# Patient Record
Sex: Female | Born: 1965 | Race: White | Hispanic: No | Marital: Married | Smoking: Never smoker
Health system: Southern US, Community
[De-identification: ages and names within clinical notes are randomized; demographics above are authoritative.]

## PROBLEM LIST (undated history)

## (undated) ENCOUNTER — Emergency Department (HOSPITAL_COMMUNITY): Payer: 59 | Source: Home / Self Care

## (undated) DIAGNOSIS — F419 Anxiety disorder, unspecified: Secondary | ICD-10-CM

## (undated) DIAGNOSIS — K219 Gastro-esophageal reflux disease without esophagitis: Secondary | ICD-10-CM

## (undated) DIAGNOSIS — I10 Essential (primary) hypertension: Secondary | ICD-10-CM

## (undated) DIAGNOSIS — E785 Hyperlipidemia, unspecified: Secondary | ICD-10-CM

## (undated) DIAGNOSIS — G43909 Migraine, unspecified, not intractable, without status migrainosus: Secondary | ICD-10-CM

## (undated) DIAGNOSIS — M1990 Unspecified osteoarthritis, unspecified site: Secondary | ICD-10-CM

## (undated) DIAGNOSIS — Z8489 Family history of other specified conditions: Secondary | ICD-10-CM

## (undated) DIAGNOSIS — F32A Depression, unspecified: Secondary | ICD-10-CM

## (undated) HISTORY — DX: Gastro-esophageal reflux disease without esophagitis: K21.9

## (undated) HISTORY — PX: SPINE SURGERY: SHX786

## (undated) HISTORY — DX: Essential (primary) hypertension: I10

## (undated) HISTORY — DX: Unspecified osteoarthritis, unspecified site: M19.90

## (undated) HISTORY — DX: Hyperlipidemia, unspecified: E78.5

## (undated) HISTORY — PX: COLONOSCOPY: SHX174

---

## 2008-10-08 ENCOUNTER — Ambulatory Visit: Payer: Self-pay

## 2009-12-29 ENCOUNTER — Ambulatory Visit: Payer: Self-pay

## 2013-06-19 ENCOUNTER — Ambulatory Visit: Payer: Self-pay

## 2013-06-21 ENCOUNTER — Ambulatory Visit: Payer: Self-pay

## 2013-12-26 ENCOUNTER — Ambulatory Visit: Payer: Self-pay

## 2014-09-19 ENCOUNTER — Other Ambulatory Visit: Payer: Self-pay | Admitting: Internal Medicine

## 2014-09-29 ENCOUNTER — Ambulatory Visit
Admission: RE | Admit: 2014-09-29 | Discharge: 2014-09-29 | Disposition: A | Discharge status: Home / Self Care | Payer: Self-pay | Source: Ambulatory Visit | Attending: Oncology | Admitting: Oncology

## 2014-09-29 ENCOUNTER — Encounter: Payer: Self-pay | Admitting: *Deleted

## 2014-09-29 ENCOUNTER — Ambulatory Visit: Payer: Self-pay | Attending: Oncology | Admitting: *Deleted

## 2014-09-29 VITALS — BP 123/81 | HR 94 | Temp 98.9°F | Ht 68.11 in | Wt 159.4 lb

## 2014-09-29 DIAGNOSIS — Z0000 Encounter for general adult medical examination without abnormal findings: Secondary | ICD-10-CM

## 2014-09-29 NOTE — Progress Notes (Signed)
Subjective:     Patient ID: Annette Rojas, female   DOB: [birth year], 49 y.o.   MRN: [id number]  HPI   Review of Systems     Objective:   Physical Exam  Pulmonary/Chest: Right breast exhibits no inverted nipple, no mass, no nipple discharge, no skin change and no tenderness. Left breast exhibits no inverted nipple, no mass, no nipple discharge, no skin change and no tenderness. Breasts are symmetrical.       Assessment:      49 year old White female returns to [facility] for annual exam.  Clinical breast exam unremarkable.  Patient states she experienced clear bilateral discharge about a year ago.  Explained that if it happens again to report back to [facility] for further evaluation.  She is agreeable.  Taught self breast awareness.    Plan:     Bilateral screening mammogram ordered.  Follow-up per protocol.

## 2014-09-30 ENCOUNTER — Encounter: Payer: Self-pay | Admitting: *Deleted

## 2014-09-30 NOTE — Progress Notes (Signed)
Letter mailed from the [facility] to inform patient of her normal mammogram results.  Patient is to follow-up with annual screening in one year.  [facility] to [name]. 

## 2015-09-07 ENCOUNTER — Other Ambulatory Visit: Payer: Self-pay | Admitting: Internal Medicine

## 2015-09-23 ENCOUNTER — Ambulatory Visit: Payer: Self-pay | Attending: Internal Medicine

## 2015-09-23 ENCOUNTER — Ambulatory Visit
Admission: RE | Admit: 2015-09-23 | Discharge: 2015-09-23 | Disposition: A | Discharge status: Home / Self Care | Payer: Self-pay | Source: Ambulatory Visit | Attending: Internal Medicine | Admitting: Internal Medicine

## 2015-09-23 ENCOUNTER — Other Ambulatory Visit: Payer: Self-pay

## 2015-09-23 VITALS — BP 124/86 | HR 80 | Temp 98.5°F | Ht 68.9 in | Wt 169.6 lb

## 2015-09-23 DIAGNOSIS — N6452 Nipple discharge: Secondary | ICD-10-CM

## 2015-09-23 NOTE — Progress Notes (Signed)
Subjective:     Patient ID: Annette Rojas, female   DOB: 30-Apr-1965, 50 y.o.   MRN: [id number]  HPI   Review of Systems     Objective:   Physical Exam  Pulmonary/Chest: Right breast exhibits nipple discharge. Right breast exhibits no inverted nipple, no mass, no skin change and no tenderness. Left breast exhibits no inverted nipple, no mass, no nipple discharge, no skin change and no tenderness. Breasts are symmetrical.       Assessment:     50 year old patient presents for [facility] clinic visit.  Patient screened, and meets BCCCP eligibility.  Patient does not have insurance, Medicare or Medicaid.  Handout given on Affordable Care Act.Instructed patient on breast self-exam using teach back method.  Patient reports she has been experiencing intermittent, clear, spontaneous right breast nipple discharge.  Finds quarter size discharge on clothing.  Last episode one month ago.  This was noted on annual exam in 2016 also.  Non-smoker.  Patient is also experiencing perimenopausal symptoms of hot flashes, bilateral breast tenderness, irregular menstrual cycles.  States she has had a twenty pound weight gain over past year.  No palpable findings.      Plan:     Sent for bilateral diagnostic mammogram, and ultrasound.  Follow-up dependent on findings, but explained to patient may plan referral to [place].

## 2015-09-23 NOTE — Progress Notes (Signed)
Radiologist recommends Bilateral breast MRI.  Orders in.  Annette Rojas to schedule appointment.  Voucher for payment faxed to be paid through [employer].

## 2015-10-15 NOTE — Progress Notes (Signed)
Patient phoned on 10/14/15 stating she was having difficulty scheduling breast MRI due to irregular menstrual cycle.  Spoke to [employer].  Patient is instructed to phone [facility] on first day of menstrual cycle.  Left message for patient to call me or Jeanella Anton for instruction ,and phone number to [place].

## 2015-11-10 ENCOUNTER — Ambulatory Visit
Admission: RE | Admit: 2015-11-10 | Discharge: 2015-11-10 | Disposition: A | Discharge status: Home / Self Care | Payer: No Typology Code available for payment source | Source: Ambulatory Visit | Attending: Internal Medicine | Admitting: Internal Medicine

## 2015-11-10 DIAGNOSIS — N6452 Nipple discharge: Secondary | ICD-10-CM

## 2015-11-10 MED ORDER — GADOBENATE DIMEGLUMINE 529 MG/ML IV SOLN: 15.0000 mL | Freq: Once | INTRAVENOUS | Status: DC | PRN

## 2016-01-07 NOTE — Progress Notes (Signed)
PAtient had a negative breast MRI on 11/11/15.  Recommendation to return to annual mammogram in July 2018.  Copy to [facility].

## 2017-01-30 ENCOUNTER — Encounter (INDEPENDENT_AMBULATORY_CARE_PROVIDER_SITE_OTHER): Payer: Self-pay

## 2017-01-30 ENCOUNTER — Encounter: Payer: Self-pay | Admitting: *Deleted

## 2017-01-30 ENCOUNTER — Ambulatory Visit: Payer: Self-pay | Attending: Oncology | Admitting: *Deleted

## 2017-01-30 ENCOUNTER — Ambulatory Visit
Admission: RE | Admit: 2017-01-30 | Discharge: 2017-01-30 | Disposition: A | Discharge status: Home / Self Care | Payer: Self-pay | Source: Ambulatory Visit | Attending: Oncology | Admitting: Oncology

## 2017-01-30 VITALS — BP 136/87 | HR 88 | Temp 98.0°F | Ht 67.0 in | Wt 181.0 lb

## 2017-01-30 DIAGNOSIS — Z0000 Encounter for general adult medical examination without abnormal findings: Secondary | ICD-10-CM

## 2017-01-30 NOTE — Patient Instructions (Signed)
Gave patient hand-out, Women Staying Healthy, Active and Well from BCCCP, with education on breast health, pap smears, heart and colon health. 

## 2017-01-30 NOTE — Progress Notes (Signed)
Subjective:     Patient ID: Annette Rojas, female   DOB: 02-Mar-1965, 51 y.o.   MRN: [id number]  HPI   Review of Systems     Objective:   Physical Exam  Pulmonary/Chest: Right breast exhibits nipple discharge. Right breast exhibits no inverted nipple, no mass, no skin change and no tenderness. Left breast exhibits no inverted nipple, no mass, no nipple discharge, no skin change and no tenderness. Breasts are symmetrical.       Assessment:     51 year old White female returns to [facility] for annual screening.  States she has had one episode of spontaneous right nipple discharge since July of this year.  Last year patient had MRI work up of the right clear nipple discharge with no findings.  Offered for patient to see a surgeon if the discharge continues.  She does not want to see anyone now, but states she will keep a log and if the discharge persist she will notify us so when can make a surgical referral.  Stressed importance of screening and follow-up due to patient's family history of breast cancer.  Her maternal cousin diagnosed in her 33's, and 2 paternal aunts with breast cancer also.  Clinical breast exam unremarkable.  Taught self breast awareness.  Patient has been screened for eligibility.  She does not have any insurance, Medicare or Medicaid.  She also meets financial eligibility.  Hand-out given on the Affordable Care Act.     Plan:     Screening mammogram ordered.  Will follow-up per BCCCP protocol.

## 2017-01-31 ENCOUNTER — Encounter: Payer: Self-pay | Admitting: *Deleted

## 2017-01-31 NOTE — Progress Notes (Signed)
Letter mailed from the [facility] to inform patient of her normal mammogram results.  Patient is to follow-up with annual screening in one year.  [facility] to [name]. 

## 2017-03-26 DIAGNOSIS — M5441 Lumbago with sciatica, right side: Secondary | ICD-10-CM | POA: Insufficient documentation

## 2018-12-31 ENCOUNTER — Other Ambulatory Visit: Payer: Self-pay

## 2018-12-31 DIAGNOSIS — Z20822 Contact with and (suspected) exposure to covid-19: Secondary | ICD-10-CM

## 2019-01-02 LAB — NOVEL CORONAVIRUS, NAA: SARS-CoV-2, NAA: DETECTED — AB

## 2019-04-09 NOTE — Progress Notes (Signed)
Patient pre-screened for BCCCP eligibility due to COVID 19 precautions. Two patient identifiers used for verification that I was speaking to correct patient.  Patient to present  to [facility] at 2:00 on 04/20/19 for breast exam and pap.

## 2019-04-10 ENCOUNTER — Ambulatory Visit
Admission: RE | Admit: 2019-04-10 | Discharge: 2019-04-10 | Disposition: A | Discharge status: Home / Self Care | Payer: Self-pay | Source: Ambulatory Visit | Attending: Oncology | Admitting: Oncology

## 2019-04-10 ENCOUNTER — Ambulatory Visit: Payer: Self-pay | Attending: Oncology

## 2019-04-10 ENCOUNTER — Other Ambulatory Visit: Payer: Self-pay

## 2019-04-10 VITALS — BP 133/83 | HR 74 | Temp 97.9°F | Resp 18 | Ht 68.2 in | Wt 184.0 lb

## 2019-04-10 DIAGNOSIS — Z0000 Encounter for general adult medical examination without abnormal findings: Secondary | ICD-10-CM | POA: Insufficient documentation

## 2019-04-10 NOTE — Progress Notes (Signed)
  Subjective:     Patient ID: Annette Rojas, female   DOB: [birth year], 54 y.o.   MRN: [id number]  HPI   Review of Systems     Objective:   Physical Exam Chest:     Breasts:        Right: No swelling, bleeding, inverted nipple, mass, nipple discharge, skin change or tenderness.        Left: No swelling, bleeding, inverted nipple, mass, nipple discharge, skin change or tenderness.       Comments: Symmetrical bilateral lower inner quadrant nodularity       Assessment:     54 year old patient returns for annual BCCCP screening.  Patient screened, and meets BCCCP eligibility.  Patient does not have insurance, Medicare or Medicaid. Instructed patient on breast self awareness using teach back method.  Clinical breast exam unremarkable.  No mass or lump palpated.  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 Offered 6 sessions of Healing Touch through [employer].      Plan:     Sent for bilateral screening mammogram. Request Healing Touch through [facility].

## 2019-04-11 NOTE — Progress Notes (Signed)
Letter mailed from [facility] to notify of normal mammogram results.  Pap results pending.  Phoned patient with negative/negative pap results.  To return in one year for BCCCP screening.  Virtual Healing Touch appointment tomorrow per patient. Copy to [place].

## 2019-04-14 LAB — IGP, APTIMA HPV: HPV Aptima: NEGATIVE

## 2020-12-16 ENCOUNTER — Other Ambulatory Visit: Payer: Self-pay

## 2020-12-16 ENCOUNTER — Ambulatory Visit: Payer: Self-pay | Attending: Oncology

## 2020-12-16 VITALS — BP 158/71 | HR 72 | Ht 68.0 in | Wt 193.0 lb

## 2020-12-16 DIAGNOSIS — Z0000 Encounter for general adult medical examination without abnormal findings: Secondary | ICD-10-CM

## 2020-12-16 DIAGNOSIS — R2233 Localized swelling, mass and lump, upper limb, bilateral: Secondary | ICD-10-CM

## 2020-12-16 NOTE — Progress Notes (Signed)
  Subjective:     Patient ID: Annette Rojas, female   DOB: 1965-05-12, 55 y.o.   MRN: [id number]  HPI   Review of Systems     Objective:   Physical Exam Chest:  Breasts:    Right: Swelling present. No bleeding, inverted nipple, mass, nipple discharge, skin change or tenderness.     Left: Swelling present. No bleeding, inverted nipple, mass, nipple discharge, skin change or tenderness.       Comments: Bilateral axillary swelling toward upper inner arm.      Assessment:     55 yo patient returns for [facility] clinic visit .  Patient screened, and meets BCCCP eligibility.  Patient does not have insurance, Medicare or Medicaid.  Instructed patient on breast self awareness using teach back method.  Clinical breast exam bilateral soft fatty tissue at inner axilla, and new per patient soft tissue lumps bilateral where axilla mets upper arm, more prominent on right.  Patient has gained 10 pounds since last visit.   Risk Assessment     Risk Scores       12/16/2020 04/10/2019   Last edited by: Theodore Demark, RN Theodore Demark, RN   5-year risk: 1.2 % 1.2 %   Lifetime risk: 8.1 % 8.3 %               Plan:     Jeanella Anton to schedule for bilateral diagnostic mammogram. Orders entered.

## 2020-12-28 ENCOUNTER — Ambulatory Visit
Admission: RE | Admit: 2020-12-28 | Discharge: 2020-12-28 | Disposition: A | Discharge status: Home / Self Care | Payer: Self-pay | Source: Ambulatory Visit | Attending: Oncology | Admitting: Oncology

## 2020-12-28 ENCOUNTER — Other Ambulatory Visit: Payer: Self-pay

## 2020-12-28 DIAGNOSIS — Z0000 Encounter for general adult medical examination without abnormal findings: Secondary | ICD-10-CM

## 2020-12-28 DIAGNOSIS — R2233 Localized swelling, mass and lump, upper limb, bilateral: Secondary | ICD-10-CM

## 2020-12-29 ENCOUNTER — Other Ambulatory Visit: Payer: Self-pay

## 2020-12-29 DIAGNOSIS — N6310 Unspecified lump in the right breast, unspecified quadrant: Secondary | ICD-10-CM

## 2020-12-29 DIAGNOSIS — N6452 Nipple discharge: Secondary | ICD-10-CM

## 2021-01-01 ENCOUNTER — Other Ambulatory Visit: Payer: Self-pay

## 2021-01-01 ENCOUNTER — Ambulatory Visit
Admission: RE | Admit: 2021-01-01 | Discharge: 2021-01-01 | Disposition: A | Discharge status: Home / Self Care | Payer: Self-pay | Source: Ambulatory Visit | Attending: Oncology | Admitting: Oncology

## 2021-01-01 DIAGNOSIS — N6452 Nipple discharge: Secondary | ICD-10-CM

## 2021-01-01 MED ORDER — GADOBUTROL 1 MMOL/ML IV SOLN
7.5000 mL | Freq: Once | INTRAVENOUS | Status: AC | PRN
  Administered 2021-01-01: 7.5 mL via INTRAVENOUS

## 2021-01-03 ENCOUNTER — Other Ambulatory Visit: Payer: Self-pay

## 2021-01-03 DIAGNOSIS — N6320 Unspecified lump in the left breast, unspecified quadrant: Secondary | ICD-10-CM

## 2021-01-08 ENCOUNTER — Ambulatory Visit: Payer: Self-pay

## 2021-01-13 ENCOUNTER — Ambulatory Visit
Admission: RE | Admit: 2021-01-13 | Discharge: 2021-01-13 | Disposition: A | Discharge status: Home / Self Care | Payer: Self-pay | Source: Ambulatory Visit | Attending: Oncology | Admitting: Oncology

## 2021-01-13 ENCOUNTER — Other Ambulatory Visit: Payer: Self-pay

## 2021-01-13 DIAGNOSIS — N6310 Unspecified lump in the right breast, unspecified quadrant: Secondary | ICD-10-CM | POA: Insufficient documentation

## 2021-01-13 HISTORY — PX: BREAST BIOPSY: SHX20

## 2021-01-14 LAB — SURGICAL PATHOLOGY

## 2021-01-18 ENCOUNTER — Other Ambulatory Visit: Payer: Self-pay | Admitting: Oncology

## 2021-01-18 DIAGNOSIS — N6320 Unspecified lump in the left breast, unspecified quadrant: Secondary | ICD-10-CM

## 2021-01-25 ENCOUNTER — Ambulatory Visit
Admission: RE | Admit: 2021-01-25 | Discharge: 2021-01-25 | Disposition: A | Discharge status: Home / Self Care | Payer: No Typology Code available for payment source | Source: Ambulatory Visit | Attending: Oncology | Admitting: Oncology

## 2021-01-25 ENCOUNTER — Other Ambulatory Visit (HOSPITAL_COMMUNITY): Payer: Self-pay | Admitting: Diagnostic Radiology

## 2021-01-25 ENCOUNTER — Other Ambulatory Visit: Payer: Self-pay

## 2021-01-25 DIAGNOSIS — N6320 Unspecified lump in the left breast, unspecified quadrant: Secondary | ICD-10-CM

## 2021-01-25 MED ORDER — GADOBUTROL 1 MMOL/ML IV SOLN
8.0000 mL | Freq: Once | INTRAVENOUS | Status: AC | PRN
  Administered 2021-01-25: 8 mL via INTRAVENOUS

## 2021-02-09 ENCOUNTER — Ambulatory Visit: Payer: Self-pay | Admitting: Surgery

## 2021-03-03 ENCOUNTER — Ambulatory Visit: Payer: Self-pay | Admitting: Surgery

## 2021-03-03 DIAGNOSIS — D242 Benign neoplasm of left breast: Secondary | ICD-10-CM | POA: Insufficient documentation

## 2021-03-03 DIAGNOSIS — N6452 Nipple discharge: Secondary | ICD-10-CM | POA: Insufficient documentation

## 2021-03-03 DIAGNOSIS — D241 Benign neoplasm of right breast: Secondary | ICD-10-CM | POA: Insufficient documentation

## 2021-03-03 NOTE — H&P (Signed)
Subjective    Chief Complaint: Left breast papilloma       History of Present Illness: Annette Rojas is a 56 y.o. female who is seen today as an office consultation at the request of Dr. Grayland Ormond for evaluation of Left breast papilloma .     This is a 56 year old postmenopausal female who presents with a 2-year history of intermittent bilateral clear nipple discharge.  She also has had intermittent left breast tenderness.  She underwent a thorough work-up including mammograms, ultrasounds, MRI, and bilateral breast biopsies.  She denies any history of bloody nipple discharge.  No previous breast biopsies.  No previous surgeries.   In the right retroareolar breast at 8:00, there is a 6 x 3 x 3 mm mass that was biopsied as a fibroadenoma.  The left lower inner quadrant shows a 6 mm intraductal papilloma.  She had another biopsy in the left upper inner quadrant that was benign.   She has a family history of breast cancer in 2 different paternal aunts and a maternal first cousin.  Both of her parents died within the last couple of years from neuroendocrine tumors.   Review of Systems: A complete review of systems was obtained from the patient.  I have reviewed this information and discussed as appropriate with the patient.  See HPI as well for other ROS.   Review of Systems  Constitutional: Negative.   HENT: Negative.   Eyes: Negative.   Respiratory: Negative.   Cardiovascular: Negative.   Gastrointestinal: Negative.   Genitourinary: Negative.   Musculoskeletal: Negative.   Skin: Negative.   Neurological: Negative.   Endo/Heme/Allergies: Negative.   Psychiatric/Behavioral: Positive for depression. The patient is nervous/anxious.         Medical History: Past Medical History      Past Medical History:  Diagnosis Date   Anxiety             Patient Active Problem List  Diagnosis   Bilateral nipple discharge   Fibroadenoma of right breast   Intraductal papilloma of  breast, left      Past Surgical History  History reviewed. No pertinent surgical history.      Allergies  Not on File           Current Outpatient Medications on File Prior to Visit  Medication Sig Dispense Refill   ALPRAZolam (XANAX) 0.25 MG tablet Take 0.25 mg by mouth 2 (two) times daily as needed       atenoloL (TENORMIN) 25 MG tablet Take 25 mg by mouth every evening       FLUoxetine (PROZAC) 20 MG capsule Take 20 mg by mouth once daily       rosuvastatin (CRESTOR) 10 MG tablet Take 10 mg by mouth every evening        No current facility-administered medications on file prior to visit.      Family History       Family History  Problem Relation Age of Onset   Diabetes Mother     Colon cancer Mother     Diabetes Brother          Social History       Tobacco Use  Smoking Status Never  Smokeless Tobacco Never      Social History  Social History        Socioeconomic History   Marital status: Single  Tobacco Use   Smoking status: Never   Smokeless tobacco: Never  Substance and Sexual Activity  Alcohol use: Never   Drug use: Never        Objective:         Vitals:    03/03/21 1616  BP: (!) 146/88  Pulse: 98  Temp: 36.7 C (98 F)  SpO2: 98%  Weight: 90 kg (198 lb 6.4 oz)  Height: 172.7 cm (5\' 8" )    Body mass index is 30.17 kg/m.   Physical Exam    Constitutional:  WDWN in NAD, conversant, no obvious deformities; lying in bed comfortably Eyes:  Pupils equal, round; sclera anicteric; moist conjunctiva; no lid lag HENT:  Oral mucosa moist; good dentition  Neck:  No masses palpated, trachea midline; no thyromegaly Lungs:  CTA bilaterally; normal respiratory effort Breasts:  symmetric, no nipple changes; no palpable masses or lymphadenopathy on either side; bilateral axillary accessory breast tissue.  No nipple discharge expressed on either side CV:  Regular rate and rhythm; no murmurs; extremities well-perfused with no edema Abd:  +bowel  sounds, soft, non-tender, no palpable organomegaly; no palpable hernias Musc:  Unable to assess gait; no apparent clubbing or cyanosis in extremities Lymphatic:  No palpable cervical or axillary lymphadenopathy Skin:  Warm, dry; no sign of jaundice Psychiatric - alert and oriented x 4; calm mood and affect     Labs, Imaging and Diagnostic Testing: CLINICAL DATA:  Single episode of bilateral clear nipple discharge. Fullness in both axillary regions.   EXAM: DIGITAL DIAGNOSTIC BILATERAL MAMMOGRAM WITH TOMOSYNTHESIS AND CAD; ULTRASOUND LEFT BREAST LIMITED; ULTRASOUND RIGHT BREAST LIMITED   TECHNIQUE: Bilateral digital diagnostic mammography and breast tomosynthesis was performed. The images were evaluated with computer-aided detection.; Targeted ultrasound examination of the left breast was performed.; Targeted ultrasound examination of the right breast was performed   COMPARISON:  Previous exam(s).   ACR Breast Density Category c: The breast tissue is heterogeneously dense, which may obscure small masses.   FINDINGS: No suspicious masses, calcifications, or distortion are identified in either breast.   On physical exam, no suspicious lumps are identified.   Targeted ultrasound is performed, showing no abnormality in the left retroareolar region or either axillary region. There is an indeterminate irregular mass in the right retroareolar region at 8 o'clock measuring 6 x 3 by 3 mm. No right axillary adenopathy.   IMPRESSION: Indeterminate right breast mass at 8 o'clock in the retroareolar region. Normal tissue in both axillary regions. No abnormalities in the left retroareolar region.   RECOMMENDATION: Recommend breast MRI given the left-sided nipple discharge without mammographic or sonographic correlate. Once the MRI has been performed, recommend ultrasound-guided biopsy of the 8 o'clock retroareolar right breast mass.   I have discussed the findings and  recommendations with the patient. If applicable, a reminder letter will be sent to the patient regarding the next appointment.   BI-RADS CATEGORY  4: Suspicious.     Electronically Signed   By: Dorise Bullion III M.D.   On: 12/28/2020 15:15   CLINICAL DATA:  The patient had a single episode of bilateral clear nipple discharge. A right 8 o'clock retroareolar mass was identified with ultrasound. No other abnormalities were noted.   LABS:  None   EXAM: BILATERAL BREAST MRI WITH AND WITHOUT CONTRAST   TECHNIQUE: Multiplanar, multisequence MR images of both breasts were obtained prior to and following the intravenous administration of 7.5 ml of Gadavist   Three-dimensional MR images were rendered by post-processing of the original MR data on an independent workstation. The three-dimensional MR images were interpreted, and findings are  reported in the following complete MRI report for this study. Three dimensional images were evaluated at the independent interpreting workstation using the DynaCAD thin client.   COMPARISON:  Mammography December 28, 2020   FINDINGS: Breast composition: c. Heterogeneous fibroglandular tissue.   Background parenchymal enhancement: Mild   Right breast: Multiple enhancing foci and small masses are seen in the right breast with no suspicious masses or non mass enhancement. No other suspicious findings on the right.   Left breast: There is a small mass in the left breast at approximately 12 o'clock as seen on series 6, images 44 and 45. This mass measures 6 mm. There is linear enhancement extending anterior to this mass spanning 9 mm. Taking the mass and linear enhancement into account, the total span is 15 mm. There is a second mass in the left breast seen on series 6, image 66 measuring 6 mm. No other suspicious findings.   Lymph nodes: No abnormal appearing lymph nodes.   Ancillary findings:  None.   IMPRESSION: There are 2 indeterminate  masses in the left breast. The most suspicious is seen on series 6, images 44 and 45. This mass measures 6 mm with 9 mm of linear enhancement extending anterior to the mass. The second mass is seen on series 6, image 66 measuring 6 mm.   No other suspicious findings in either breast.   RECOMMENDATION: Recommend MRI guided biopsy of the 2 left breast masses described above. Recommend ultrasound-guided biopsy of the 8 o'clock retroareolar mass seen on ultrasound.   BI-RADS CATEGORY  4: Suspicious.     Electronically Signed   By: Dorise Bullion III M.D.   On: 01/01/2021 13:40   DIAGNOSIS:  A. BREAST, RIGHT AT 8:00, RETROAREOLAR; ULTRASOUND-GUIDED CORE NEEDLE  BIOPSY:  - FRAGMENTS OF BENIGN FIBROADENOMA.  - NEGATIVE FOR ATYPICAL PROLIFERATIVE BREAST DISEASE.    Diagnosis 1. Breast, left, needle core biopsy, upper inner quadrant, cylinder clip - USUAL DUCTAL HYPERPLASIA AND FIBROCYSTIC CHANGES - NO MALIGNANCY IDENTIFIED 2. Breast, left, needle core biopsy, lower inner quadrant, barbell clip - INTRADUCTAL PAPILLOMA - NO ATYPIA OR MALIGNANCY IDENTIFIED Microscopic Comment 1. These results were called to [facility] on January 26, 2021. Thressa Sheller MD Pathologist, Electronic Signature (Case signed 01/26/2021)   Assessment and Plan:  Diagnoses and all orders for this visit:   Bilateral nipple discharge   Fibroadenoma of right breast   Intraductal papilloma of breast, left       Bilateral radioactive seed localized lumpectomies.  The surgical procedure has been discussed with the patient.  Potential risks, benefits, alternative treatments, and expected outcomes have been explained.  All of the patient's questions at this time have been answered.  The likelihood of reaching the patient's treatment goal is good.  The patient understand the proposed surgical procedure and wishes to proceed.     No follow-ups on file.   Annette Rojas Jearld Adjutant, MD   03/03/2021 5:13 PM

## 2021-03-10 NOTE — Progress Notes (Unsigned)
Patient received biopsy results intraductal papilloma with radiology recommendation for surgical consult. Scheduled with Dr. Georgette Dover at [facility] surgery in [place].  Explained to patient the BCCCP  assistance  with excision, may be limited due to benign finding. Copy to [facility].

## 2021-03-17 ENCOUNTER — Other Ambulatory Visit: Payer: Self-pay | Admitting: Surgery

## 2021-03-17 DIAGNOSIS — D241 Benign neoplasm of right breast: Secondary | ICD-10-CM

## 2021-03-17 DIAGNOSIS — D242 Benign neoplasm of left breast: Secondary | ICD-10-CM

## 2021-04-07 ENCOUNTER — Encounter: Payer: Self-pay | Admitting: Emergency Medicine

## 2021-04-07 ENCOUNTER — Ambulatory Visit
Admission: EM | Admit: 2021-04-07 | Discharge: 2021-04-07 | Disposition: A | Discharge status: Home / Self Care | Payer: 59 | Attending: Medical Oncology | Admitting: Medical Oncology

## 2021-04-07 ENCOUNTER — Other Ambulatory Visit: Payer: Self-pay

## 2021-04-07 DIAGNOSIS — M5431 Sciatica, right side: Secondary | ICD-10-CM

## 2021-04-07 MED ORDER — METHYLPREDNISOLONE SODIUM SUCC 125 MG IJ SOLR
125.0000 mg | Freq: Once | INTRAMUSCULAR | Status: AC
  Administered 2021-04-07: 125 mg via INTRAMUSCULAR

## 2021-04-07 MED ORDER — METHOCARBAMOL 500 MG PO TABS: 500.0000 mg | ORAL_TABLET | Freq: Two times a day (BID) | ORAL | 0 refills | Status: DC

## 2021-04-07 NOTE — ED Triage Notes (Signed)
Pt is here with right lower back/buttock pain that shoots to lateral part of right hip and then down he front of that right thigh. Pt states she has back pain often, but this is much worse. Denies numbness. Pt is having double benign lumpectomy on 2/22.

## 2021-04-07 NOTE — ED Provider Notes (Signed)
Annette Rojas    CSN: [id number] Arrival date & time: 04/07/21  1107      History   Chief Complaint Chief Complaint  Patient presents with   Leg Pain   Back Pain   Hip Pain    HPI [name] is a 56 y.o. female.   HPI  Back Pain: Pt reports that for the past 3 days she has had right lower back pain. Normally has chronic back pain off and on but current pain is not resolving like hers normally does with stretches and ibuprofen. This pain shoots down her right leg. No known injury. She denies saddle anesthesia, incontinence or neuro changes.    History reviewed. No pertinent past medical history.  There are no problems to display for this patient.   Past Surgical History:  Procedure Laterality Date   BREAST BIOPSY Right 01/13/2021   [place] bx, heart marker, path pending    OB History   No obstetric history on file.      Home Medications    Prior to Admission medications   Medication Sig Start Date End Date Taking? Authorizing Provider  methocarbamol (ROBAXIN) 500 MG tablet Take 1 tablet (500 mg total) by mouth 2 (two) times daily. 04/07/21  Yes Hughie Closs, PA-C    Family History Family History  Problem Relation Age of Onset   Breast cancer Cousin 19       maternal side   Breast cancer Paternal Aunt 57   Breast cancer Paternal Aunt 4    Social History Social History   Tobacco Use   Smoking status: Never   Smokeless tobacco: Never  Substance Use Topics   Alcohol use: Not Currently   Drug use: Never     Allergies   Patient has no known allergies.   Review of Systems Review of Systems  As stated above in HPI Physical Exam Triage Vital Signs ED Triage Vitals  Enc Vitals Group     BP 04/07/21 1203 (!) 152/89     Pulse Rate 04/07/21 1203 76     Resp 04/07/21 1203 18     Temp 04/07/21 1203 98.5 F (36.9 C)     Temp Source 04/07/21 1203 Oral     SpO2 04/07/21 1203 96 %     Weight --      Height --      Head  Circumference --      Peak Flow --      Pain Score 04/07/21 1225 8     Pain Loc --      Pain Edu? --      Excl. in [place]? --    No data found.  Updated Vital Signs BP (!) 152/89 (BP Location: Left Arm)    Pulse 76    Temp 98.5 F (36.9 C) (Oral)    Resp 18    SpO2 96%   Physical Exam Vitals and nursing note reviewed.  Constitutional:      General: She is not in acute distress.    Appearance: Normal appearance. She is not ill-appearing, toxic-appearing or diaphoretic.  Cardiovascular:     Rate and Rhythm: Normal rate and regular rhythm.     Heart sounds: Normal heart sounds.  Pulmonary:     Effort: Pulmonary effort is normal.     Breath sounds: Normal breath sounds.  Musculoskeletal:        General: No swelling or tenderness. Normal range of motion.     Right lower leg:  No edema.     Left lower leg: No edema.  Skin:    General: Skin is warm.  Neurological:     General: No focal deficit present.     Mental Status: She is alert and oriented to person, place, and time. Mental status is at baseline.     Cranial Nerves: No cranial nerve deficit.     Sensory: No sensory deficit.     Motor: No weakness.     Coordination: Coordination normal.     Gait: Gait normal.     Deep Tendon Reflexes: Reflexes normal.     UC Treatments / Results  Labs (all labs ordered are listed, but only abnormal results are displayed) Labs Reviewed - No data to display  EKG   Radiology No results found.  Procedures Procedures (including critical care time)  Medications Ordered in UC Medications  methylPREDNISolone sodium succinate (SOLU-MEDROL) 125 mg/2 mL injection 125 mg (has no administration in time range)    Initial Impression / Assessment and Plan / UC Course  I have reviewed the triage vital signs and the nursing notes.  Pertinent labs & imaging results that were available during my care of the patient were reviewed by me and considered in my medical decision making (see chart for  details).     New.  Appears to be sciatica.  Treating with Solu-Medrol, methocarbamol and Aleve.  Discussed how to use these along with common potential side effects and precautions.  She would benefit from heating pad therapy and stretching exercises.  Discussed red flag signs and symptoms.  Follow-up as needed. Final Clinical Impressions(s) / UC Diagnoses   Final diagnoses:  Sciatica of right side   Discharge Instructions   None    ED Prescriptions     Medication Sig Dispense Auth. Provider   methocarbamol (ROBAXIN) 500 MG tablet Take 1 tablet (500 mg total) by mouth 2 (two) times daily. 20 tablet Hughie Closs, [place]      PDMP not reviewed this encounter.   Hughie Closs, [place] 04/07/21 1255

## 2021-04-13 ENCOUNTER — Encounter (HOSPITAL_BASED_OUTPATIENT_CLINIC_OR_DEPARTMENT_OTHER): Payer: Self-pay | Admitting: Surgery

## 2021-04-13 ENCOUNTER — Other Ambulatory Visit: Payer: Self-pay

## 2021-04-20 ENCOUNTER — Emergency Department
Admission: EM | Admit: 2021-04-20 | Discharge: 2021-04-20 | Disposition: A | Discharge status: Home / Self Care | Payer: 59 | Attending: Emergency Medicine | Admitting: Emergency Medicine

## 2021-04-20 ENCOUNTER — Ambulatory Visit: Admission: RE | Admit: 2021-04-20 | Payer: No Typology Code available for payment source | Source: Ambulatory Visit

## 2021-04-20 ENCOUNTER — Other Ambulatory Visit: Payer: Self-pay

## 2021-04-20 ENCOUNTER — Ambulatory Visit: Payer: No Typology Code available for payment source

## 2021-04-20 ENCOUNTER — Encounter (HOSPITAL_BASED_OUTPATIENT_CLINIC_OR_DEPARTMENT_OTHER)
Admission: RE | Admit: 2021-04-20 | Discharge: 2021-04-20 | Disposition: A | Discharge status: Home / Self Care | Payer: 59 | Source: Ambulatory Visit | Attending: Surgery | Admitting: Surgery

## 2021-04-20 ENCOUNTER — Emergency Department: Payer: 59

## 2021-04-20 DIAGNOSIS — G8929 Other chronic pain: Secondary | ICD-10-CM | POA: Insufficient documentation

## 2021-04-20 DIAGNOSIS — Z01812 Encounter for preprocedural laboratory examination: Secondary | ICD-10-CM | POA: Diagnosis not present

## 2021-04-20 DIAGNOSIS — M5441 Lumbago with sciatica, right side: Secondary | ICD-10-CM | POA: Diagnosis not present

## 2021-04-20 DIAGNOSIS — M545 Low back pain, unspecified: Secondary | ICD-10-CM | POA: Diagnosis present

## 2021-04-20 MED ORDER — DEXAMETHASONE SODIUM PHOSPHATE 10 MG/ML IJ SOLN
10.0000 mg | Freq: Once | INTRAMUSCULAR | Status: AC
  Administered 2021-04-20: 10 mg via INTRAMUSCULAR
  Filled 2021-04-20: qty 1

## 2021-04-20 MED ORDER — CYCLOBENZAPRINE HCL 5 MG PO TABS: 5.0000 mg | ORAL_TABLET | Freq: Three times a day (TID) | ORAL | 0 refills | Status: AC | PRN

## 2021-04-20 NOTE — ED Provider Triage Note (Signed)
Emergency Medicine Provider Triage Evaluation Note  Annette Rojas , a 56 y.o. female  was evaluated in triage.  Pt complains of right-sided sciatica.  Pain burning numbness and tingling going down the right leg.  Symptoms been ongoing for years.  No weakness or loss of bowel or bladder symptoms..  Review of Systems  Positive: Numbness pain right leg, right lower back Negative: Abdominal pain dysuria, weakness, fever  Physical Exam  LMP 08/23/2015  Gen:   Awake, no distress   Resp:  Normal effort  MSK:   Moves extremities without difficulty  Other:    Medical Decision Making  Medically screening exam initiated at 5:50 PM.  Appropriate orders placed.  Annette Rojas was informed that the remainder of the evaluation will be completed by another provider, this initial triage assessment does not replace that evaluation, and the importance of remaining in the ED until their evaluation is complete.  Patient given 10 mg of IM dexamethasone.  Will order x-ray lumbar spine.   Annette Rojas, [place] 04/20/21 1805

## 2021-04-20 NOTE — ED Triage Notes (Signed)
Pt to ED via POV from home. Pt reports right lower back pain that radiates into buttocks and leg. Pt seen at [facility] a few weeks ago and was given a a steroid shot with relief. Pt has been taking ibuprofen and muscle relaxer at home for relief. Pt states she was suppose to get double breast lumpectomy tomorrow so she had stopped her ibuprofen and pain has gotten worse.

## 2021-04-20 NOTE — ED Provider Notes (Signed)
[facility] Provider Note    Event Date/Time   First MD Initiated Contact with Patient 04/20/21 1842     (approximate)   History   Chief Complaint Back Pain   HPI Annette Rojas is a 56 y.o. female, history of anxiety, migraines, chronic back pain, presents to the emergency department for evaluation of back pain.  Patient states that she has had chronic back pain for years, but has recently had burning/tingling sensation from her lower back radiating into her bottom and right lower extremity.  She states that she presented to urgent care for this a few weeks ago and received a steroid shot which helped.  Denies any recent injuries or illnesses.  Denies fever/chills, chest pain, shortness of breath, abdominal pain, urinary symptoms, flank pain, loss of bowel or bladder symptoms, decreased motor function, or rashes/lesions.  Patient states that she is still able to ambulate well.  History Limitations: No limitations.      Physical Exam  Triage Vital Signs: ED Triage Vitals  Enc Vitals Group     BP 04/20/21 1750 (!) 153/86     Pulse Rate 04/20/21 1750 89     Resp 04/20/21 1750 17     Temp 04/20/21 1750 98.2 F (36.8 C)     Temp Source 04/20/21 1750 Oral     SpO2 04/20/21 1750 96 %     Weight 04/20/21 1810 198 lb 6.6 oz (90 kg)     Height 04/20/21 1810 5\' 8"  (1.727 m)     Head Circumference --      Peak Flow --      Pain Score 04/20/21 1810 8     Pain Loc --      Pain Edu? --      Excl. in [place]? --     Most recent vital signs: Vitals:   04/20/21 1750  BP: (!) 153/86  Pulse: 89  Resp: 17  Temp: 98.2 F (36.8 C)  SpO2: 96%    General: Awake, NAD.  CV: Good peripheral perfusion.  Resp: Normal effort.  Abd: Soft, non-tender. No distention.  Neuro: At baseline. No gross neurological deficits. Other: Patient is able to ambulate well.  No gross deformities in the right lower extremity.  Pulse, motor, sensation intact distally.  Mild  tenderness when palpating the L5 region.  Positive straight leg test.   Physical Exam    ED Results / Procedures / Treatments  Labs (all labs ordered are listed, but only abnormal results are displayed) Labs Reviewed - No data to display   EKG Not applicable.   RADIOLOGY  ED Provider Interpretation: I personally interpreted and reviewed this image, no acute findings.  DG Lumbar Spine 2-3 Views  Result Date: 04/20/2021 CLINICAL DATA:  Back pain EXAM: LUMBAR SPINE - 2-3 VIEW COMPARISON:  None. FINDINGS: No recent fracture is seen. There is minimal retrolisthesis at L2-L3, L3-L4 and L4-L5 levels. Anterior bony spurs seen at multiple levels. Facet hypertrophy is seen, more so at L5-S1 level. There is disc space narrowing at L5-S1 level. IMPRESSION: No recent fracture is seen. Lumbar spondylosis, particularly severe at L5-S1 level. There is minimal retrolisthesis at multiple levels. Electronically Signed   By: Elmer Picker M.D.   On: 04/20/2021 18:30    PROCEDURES:  Critical Care performed: None.  Procedures    MEDICATIONS ORDERED IN ED: Medications  dexamethasone (DECADRON) injection 10 mg (10 mg Intramuscular Given 04/20/21 1831)     IMPRESSION / MDM / ASSESSMENT  AND PLAN / ED COURSE  I reviewed the triage vital signs and the nursing notes.                              Annette Rojas is a 56 y.o. female, history of anxiety, migraines, chronic back pain, presents to the emergency department for evaluation of back pain.  Patient states that she has had chronic back pain for years, but has recently had burning/tingling sensation from her lower back radiating into her bottom and right lower extremity.    Differential diagnosis includes, but is not limited to, lumbar fracture, bulging disc, sciatica, lumbosacral strain.  ED Course Patient appears well.  Vital signs within normal limits.  NAD.  Patient received 10 mg dexamethasone in triage  Lumbar x-ray notable  for lumbar spondylosis, particular severe at L5 to LS 1 level.  There is minimal retrolisthesis at multiple levels.  Assessment/Plan History, physical exam, and work-up consistent with low back pain with sciatica.  Patient was treated here with 10 mg dexamethasone IM.  We will plan to discharge this patient with cyclobenzaprine as well.  We will additionally provide a referral to orthopedics.  Patient was provided with anticipatory guidance, return precautions, and educational material. Encouraged the patient to return to the emergency department at any time if they begin to experience any new or worsening symptoms.       FINAL CLINICAL IMPRESSION(S) / ED DIAGNOSES   Final diagnoses:  Chronic right-sided low back pain with right-sided sciatica     Rx / DC Orders   ED Discharge Orders          Ordered    cyclobenzaprine (FLEXERIL) 5 MG tablet  3 times daily PRN        04/20/21 2015             Note:  This document was prepared using Dragon voice recognition software and may include unintentional dictation errors.   Teodoro Spray, [place] 04/20/21 2324    Nena Polio, MD 04/20/21 [phone]

## 2021-04-20 NOTE — Progress Notes (Addendum)
Sent text reminding pt to come in for EKG and pre surgery drink and a soap.   Surgical soap given with instructions, pt verbalized understanding. Enhanced Recovery after Surgery  Enhanced Recovery after Surgery is a protocol used to improve the stress on your body and your recovery after surgery.  Patient Instructions  The night before surgery:  No food after midnight. ONLY clear liquids after midnight  The day of surgery (if you do NOT have diabetes):  Drink ONE (1) Pre-Surgery Clear Ensure as directed.   This drink was given to you during your hospital  pre-op appointment visit. The pre-op nurse will instruct you on the time to drink the  Pre-Surgery Ensure depending on your surgery time. Finish the drink at the designated time by the pre-op nurse.  Nothing else to drink after completing the  Pre-Surgery Clear Ensure.  The day of surgery (if you have diabetes): Drink ONE (1) Gatorade 2 (G2) as directed. This drink was given to you during your hospital  pre-op appointment visit.  The pre-op nurse will instruct you on the time to drink the   Gatorade 2 (G2) depending on your surgery time. Color of the Gatorade may vary. Red is not allowed. Nothing else to drink after completing the  Gatorade 2 (G2).         If office.you have questions, please contact your surgeons office

## 2021-04-20 NOTE — Discharge Instructions (Addendum)
-  Treat with Tylenol and naproxen for pain.  Use cyclobenzaprine as needed. -Follow-up with the orthopedist listed above. -Return to the emergency department anytime if you begin to experience any new or worsening symptoms.

## 2021-04-20 NOTE — ED Notes (Signed)
See triage note. Pt has received dexamethasone.

## 2021-04-21 ENCOUNTER — Ambulatory Visit (HOSPITAL_BASED_OUTPATIENT_CLINIC_OR_DEPARTMENT_OTHER): Admission: RE | Admit: 2021-04-21 | Payer: 59 | Source: Home / Self Care | Admitting: Surgery

## 2021-04-21 HISTORY — DX: Anxiety disorder, unspecified: F41.9

## 2021-04-21 HISTORY — DX: Migraine, unspecified, not intractable, without status migrainosus: G43.909

## 2021-04-21 HISTORY — DX: Depression, unspecified: F32.A

## 2021-04-21 SURGERY — BREAST LUMPECTOMY WITH RADIOACTIVE SEED LOCALIZATION
Anesthesia: General | Site: Breast | Laterality: Bilateral

## 2021-04-26 ENCOUNTER — Other Ambulatory Visit: Payer: Self-pay

## 2021-04-26 ENCOUNTER — Encounter: Payer: Self-pay | Admitting: Physician Assistant

## 2021-04-26 ENCOUNTER — Ambulatory Visit (INDEPENDENT_AMBULATORY_CARE_PROVIDER_SITE_OTHER): Payer: 59 | Admitting: Physician Assistant

## 2021-04-26 VITALS — Ht 68.0 in | Wt 201.0 lb

## 2021-04-26 DIAGNOSIS — M5440 Lumbago with sciatica, unspecified side: Secondary | ICD-10-CM

## 2021-04-26 MED ORDER — METHYLPREDNISOLONE 4 MG PO TBPK: ORAL_TABLET | ORAL | 0 refills | Status: DC

## 2021-04-26 NOTE — Progress Notes (Signed)
Office Visit Note   Patient: Annette Rojas           Date of Birth: [birth year]           MRN: [id number] Visit Date: 04/26/2021              Requested by: Albina Billet, MD [address]   [place],  [place] [address] PCP: Albina Billet, MD  Chief Complaint  Patient presents with   Lower Back - Pain      HPI: Patient is a pleasant 56 year old woman with a history of lower back pain.  She denies any specific injury but states she has been active her entire life.  Most recently her pain has been increasing over the last month or 2.  She had been taking care of both of her parents who recently passed away.  Pain was significant enough that she went to an urgent care.  She was given a muscle relaxant as well as a steroid injection into her arm.  She said this provided her little to no relief.  She had exacerbation of her symptoms and then went to an emergency room.  Again she was given a steroid injection and a stronger muscle relaxant.  She says she is not gotten any relief.  She denies any loss of bowel or bladder control.  In addition to the muscle relaxant and the shots she is also tried anti-inflammatories.  She is engaged in a stretching  Plan and back strengthening exercises.  Still she has no relief.  She denies any loss of bowel or bladder control  Assessment & Plan: Visit Diagnoses:  1. Acute right-sided low back pain with sciatica, sciatica laterality unspecified     Plan: Pleasant 56 year old woman with history of lower back pain with sciatic symptoms down to her right buttock to her right foot.  This is worsening despite trials of conservative treatment in the last 6 weeks.  I have recommended an MRI.  Her x-ray does show significant degenerative changes especially L4-5 L5-S1.  Based on the MRI she could be referred to Dr. Ernestina Patches or [facility] imaging for epidural steroid injections.  She is fine with me calling with her with the results  Follow-Up Instructions: No  follow-ups on file.   Ortho Exam  Patient is alert, oriented, no adenopathy, well-dressed, normal affect, normal respiratory effort. Examination of her lumbar spine she has minimal pain with flexion she does have pain when she extends that reproduces the sciatica in her right buttock.  Side to side bending does not affect her much.  She has a mild straight leg raise.  She has 5 out of 5 strength equivalent with good resisted flexion extension of her hips knees and ankles.  Deep tendon reflexes are equivalent.  Note specific abnormality to palpation in her spine.  Imaging: No results found. No images are attached to the encounter.  Labs: No results found for: HGBA1C, ESRSEDRATE, CRP, LABURIC, REPTSTATUS, GRAMSTAIN, CULT, LABORGA   No results found for: ALBUMIN, PREALBUMIN, CBC  No results found for: MG No results found for: VD25OH  No results found for: PREALBUMIN No flowsheet data found.   Body mass index is 30.56 kg/m.  Orders:  Orders Placed This Encounter  Procedures   MR Lumbar Spine w/o contrast   Meds ordered this encounter  Medications   methylPREDNISolone (MEDROL DOSEPAK) 4 MG TBPK tablet    Sig: Take as directed on package    Dispense:  21  tablet    Refill:  0     Procedures: No procedures performed  Clinical Data: No additional findings.  ROS:  All other systems negative, except as noted in the HPI. Review of Systems  Objective: Vital Signs: Ht 5\' 8"  (1.727 m)    Wt 201 lb (91.2 kg)    LMP 08/23/2015    BMI 30.56 kg/m   Specialty Comments:  No specialty comments available.  PMFS History: There are no problems to display for this patient.  Past Medical History:  Diagnosis Date   Anxiety    Depression    Migraines     Family History  Problem Relation Age of Onset   Breast cancer Cousin 66       maternal side   Breast cancer Paternal Aunt 36   Breast cancer Paternal Aunt 17    Past Surgical History:  Procedure Laterality Date   BREAST  BIOPSY Right 01/13/2021   [place] bx, heart marker, path pending   Social History   Occupational History   Not on file  Tobacco Use   Smoking status: Never   Smokeless tobacco: Never  Substance and Sexual Activity   Alcohol use: Not Currently    Comment: occ.   Drug use: Never   Sexual activity: Not on file

## 2021-04-27 NOTE — Addendum Note (Signed)
Addended by: Marlyne Beards on: 04/27/2021 04:34 PM   Modules accepted: Orders

## 2021-05-02 ENCOUNTER — Ambulatory Visit (HOSPITAL_COMMUNITY)
Admission: RE | Admit: 2021-05-02 | Discharge: 2021-05-02 | Disposition: A | Discharge status: Home / Self Care | Payer: 59 | Source: Ambulatory Visit | Attending: Physician Assistant | Admitting: Physician Assistant

## 2021-05-02 ENCOUNTER — Other Ambulatory Visit: Payer: Self-pay

## 2021-05-02 DIAGNOSIS — M5440 Lumbago with sciatica, unspecified side: Secondary | ICD-10-CM | POA: Insufficient documentation

## 2021-05-03 ENCOUNTER — Telehealth: Payer: Self-pay

## 2021-05-03 ENCOUNTER — Other Ambulatory Visit: Payer: Self-pay

## 2021-05-03 DIAGNOSIS — M5440 Lumbago with sciatica, unspecified side: Secondary | ICD-10-CM

## 2021-05-03 NOTE — Telephone Encounter (Signed)
Patient called stating that the MRI results have came in and she would like a call to see what the next steps would be.  ?

## 2021-05-05 ENCOUNTER — Other Ambulatory Visit: Payer: Self-pay

## 2021-05-05 ENCOUNTER — Telehealth: Payer: Self-pay | Admitting: Physician Assistant

## 2021-05-05 DIAGNOSIS — M5440 Lumbago with sciatica, unspecified side: Secondary | ICD-10-CM

## 2021-05-05 NOTE — Telephone Encounter (Signed)
Pt called and states [facility] does not take her appt. Wondering if you can put in a referral with Dr. Ernestina Patches? ? ?CB [phone] ?

## 2021-05-05 NOTE — Telephone Encounter (Signed)
Called patient. No answer. Left message that referral for ESI of L-Spine has been sent to Dr.Newton.  ?

## 2021-05-07 ENCOUNTER — Ambulatory Visit (HOSPITAL_COMMUNITY): Payer: 59

## 2021-05-14 ENCOUNTER — Telehealth: Payer: Self-pay | Admitting: Physical Medicine and Rehabilitation

## 2021-05-14 NOTE — Telephone Encounter (Signed)
Pt is calling to see if there is any cancellations----she can come anytime --please call  ?

## 2021-05-31 ENCOUNTER — Ambulatory Visit: Payer: Self-pay

## 2021-05-31 ENCOUNTER — Encounter: Payer: Self-pay | Admitting: Physical Medicine and Rehabilitation

## 2021-05-31 ENCOUNTER — Ambulatory Visit: Payer: 59 | Admitting: Physical Medicine and Rehabilitation

## 2021-05-31 VITALS — BP 131/84 | HR 87

## 2021-05-31 DIAGNOSIS — M5416 Radiculopathy, lumbar region: Secondary | ICD-10-CM | POA: Diagnosis not present

## 2021-05-31 MED ORDER — METHYLPREDNISOLONE ACETATE 80 MG/ML IJ SUSP
80.0000 mg | Freq: Once | INTRAMUSCULAR | Status: AC
  Administered 2021-05-31: 80 mg

## 2021-05-31 NOTE — Patient Instructions (Signed)

## 2021-05-31 NOTE — Progress Notes (Signed)
Pt state lower back pain that travels down her right leg. Pt state walking, standing and laying down makes the pain worse. Pt state she takes over the counter pain meds and uses heat to help ease her pain. ? ?Numeric Pain Rating Scale and Functional Assessment ?Average Pain 7 ? ? ?In the last MONTH (on 0-10 scale) has pain interfered with the following? ? ?1. General activity like being  able to carry out your everyday physical activities such as walking, climbing stairs, carrying groceries, or moving a chair?  ?Rating(10) ? ? ?+Driver, -BT, -Dye Allergies. ? ?

## 2021-06-07 ENCOUNTER — Telehealth: Payer: Self-pay | Admitting: Physician Assistant

## 2021-06-07 NOTE — Telephone Encounter (Signed)
Pt called states that injection has not helped with her pains or numbness and would like to discuss next options. Pease call pt at [phone]. ?

## 2021-06-08 ENCOUNTER — Other Ambulatory Visit: Payer: Self-pay | Admitting: Physical Medicine and Rehabilitation

## 2021-06-08 DIAGNOSIS — M5416 Radiculopathy, lumbar region: Secondary | ICD-10-CM

## 2021-06-08 NOTE — Progress Notes (Signed)
Spoke with patient via telephone this afternoon, reports she is now having lower back pain radiating to buttock and down posterior legs. Patient reports minimal relief (less than 50%) with recent right L5-S1 interlaminar epidural steroid injection. I will place an order for bilateral S1 transforaminal epidural steroid injection.  ?

## 2021-06-15 ENCOUNTER — Telehealth: Payer: Self-pay | Admitting: Physical Medicine and Rehabilitation

## 2021-06-15 NOTE — Telephone Encounter (Signed)
Patient called asked for a call back concerning an appointment with Dr. Ernestina Patches.   Pt # [phone] ?

## 2021-06-18 ENCOUNTER — Telehealth: Payer: Self-pay | Admitting: Physical Medicine and Rehabilitation

## 2021-06-18 NOTE — Telephone Encounter (Signed)
Patient called to reschedule her appointment. The number to contact patient is [phone] ?

## 2021-06-22 NOTE — Procedures (Signed)
Lumbar Epidural Steroid Injection - Interlaminar Approach with Fluoroscopic Guidance ? ?Patient: Annette Rojas      ?Date of Birth: 1965/11/02 ?MRN: [id number] ?PCP: Albina Billet, MD      ?Visit Date: 05/31/2021 ?  ?Universal Protocol:    ? ?Consent Given By: the patient ? ?Position: PRONE ? ?Additional Comments: ?Vital signs were monitored before and after the procedure. ?Patient was prepped and draped in the usual sterile fashion. ?The correct patient, procedure, and site was verified. ? ? ?Injection Procedure Details:  ? ?Procedure diagnoses: Lumbar radiculopathy [M54.16]  ? ?Meds Administered:  ?Meds ordered this encounter  ?Medications  ? methylPREDNISolone acetate (DEPO-MEDROL) injection 80 mg  ?  ? ?Laterality: Right ? ?Location/Site:  L5-S1 ? ?Needle: 3.5 in., 20 ga. Tuohy ? ?Needle Placement: Paramedian epidural ? ?Findings:  ? -Comments: Excellent flow of contrast into the epidural space. ? ?Procedure Details: ?Using a paramedian approach from the side mentioned above, the region overlying the inferior lamina was localized under fluoroscopic visualization and the soft tissues overlying this structure were infiltrated with 4 ml. of 1% Lidocaine without Epinephrine. The Tuohy needle was inserted into the epidural space using a paramedian approach.  ? ?The epidural space was localized using loss of resistance along with counter oblique bi-planar fluoroscopic views.  After negative aspirate for air, blood, and CSF, a 2 ml. volume of Isovue-250 was injected into the epidural space and the flow of contrast was observed. Radiographs were obtained for documentation purposes.   ? ?The injectate was administered into the level noted above. ? ? ?Additional Comments:  ?The patient tolerated the procedure well ?Dressing: 2 x 2 sterile gauze and Band-Aid ?  ? ?Post-procedure details: ?Patient was observed during the procedure. ?Post-procedure instructions were reviewed. ? ?Patient left the clinic in stable  condition. ?

## 2021-06-22 NOTE — Progress Notes (Signed)
? ?Annette Rojas - 55 y.o. female MRN [id number]  Date of birth: 1965-05-23 ? ?Office Visit Note: ?Visit Date: 05/31/2021 ?PCP: Albina Billet, MD ?Referred by: Albina Billet, MD ? ?Subjective: ?Chief Complaint  ?Patient presents with  ? Lower Back - Pain  ? Right Leg - Pain  ? ?HPI:  Annette Rojas is a 56 y.o. female who comes in today at the request of [place], PA-C for planned Right L5-S1 Lumbar Interlaminar epidural steroid injection with fluoroscopic guidance.  The patient has failed conservative care including home exercise, medications, time and activity modification.  This injection will be diagnostic and hopefully therapeutic.  Please see requesting physician notes for further details and justification.  ? ?ROS Otherwise per HPI. ? ?Assessment & Plan: ?Visit Diagnoses:  ?  ICD-10-CM   ?1. Lumbar radiculopathy  M54.16 XR C-ARM NO REPORT  ?  Epidural Steroid injection  ?  methylPREDNISolone acetate (DEPO-MEDROL) injection 80 mg  ?  ?  ?Plan: No additional findings.  ? ?Meds & Orders:  ?Meds ordered this encounter  ?Medications  ? methylPREDNISolone acetate (DEPO-MEDROL) injection 80 mg  ?  ?Orders Placed This Encounter  ?Procedures  ? XR C-ARM NO REPORT  ? Epidural Steroid injection  ?  ?Follow-up: Return for visit to requesting provider as needed.  ? ?Procedures: ?No procedures performed  ?Lumbar Epidural Steroid Injection - Interlaminar Approach with Fluoroscopic Guidance ? ?Patient: Annette Rojas      ?Date of Birth: 1966/02/25 ?MRN: [id number] ?PCP: Albina Billet, MD      ?Visit Date: 05/31/2021 ?  ?Universal Protocol:    ? ?Consent Given By: the patient ? ?Position: PRONE ? ?Additional Comments: ?Vital signs were monitored before and after the procedure. ?Patient was prepped and draped in the usual sterile fashion. ?The correct patient, procedure, and site was verified. ? ? ?Injection Procedure Details:  ? ?Procedure diagnoses: Lumbar radiculopathy [M54.16]  ? ?Meds Administered:   ?Meds ordered this encounter  ?Medications  ? methylPREDNISolone acetate (DEPO-MEDROL) injection 80 mg  ?  ? ?Laterality: Right ? ?Location/Site:  L5-S1 ? ?Needle: 3.5 in., 20 ga. Tuohy ? ?Needle Placement: Paramedian epidural ? ?Findings:  ? -Comments: Excellent flow of contrast into the epidural space. ? ?Procedure Details: ?Using a paramedian approach from the side mentioned above, the region overlying the inferior lamina was localized under fluoroscopic visualization and the soft tissues overlying this structure were infiltrated with 4 ml. of 1% Lidocaine without Epinephrine. The Tuohy needle was inserted into the epidural space using a paramedian approach.  ? ?The epidural space was localized using loss of resistance along with counter oblique bi-planar fluoroscopic views.  After negative aspirate for air, blood, and CSF, a 2 ml. volume of Isovue-250 was injected into the epidural space and the flow of contrast was observed. Radiographs were obtained for documentation purposes.   ? ?The injectate was administered into the level noted above. ? ? ?Additional Comments:  ?The patient tolerated the procedure well ?Dressing: 2 x 2 sterile gauze and Band-Aid ?  ? ?Post-procedure details: ?Patient was observed during the procedure. ?Post-procedure instructions were reviewed. ? ?Patient left the clinic in stable condition.  ? ?Clinical History: ?No specialty comments available.  ? ? ? ?Objective:  VS:  HT:    WT:   BMI:     BP:131/84  HR:87bpm  TEMP: ( )  RESP:  ?Physical Exam ?Vitals and nursing note reviewed.  ?Constitutional:   ?   General: She is  not in acute distress. ?   Appearance: Normal appearance. She is not ill-appearing.  ?HENT:  ?   Head: Normocephalic and atraumatic.  ?   Right Ear: External ear normal.  ?   Left Ear: External ear normal.  ?Eyes:  ?   Extraocular Movements: Extraocular movements intact.  ?Cardiovascular:  ?   Rate and Rhythm: Normal rate.  ?   Pulses: Normal pulses.  ?Pulmonary:  ?    Effort: Pulmonary effort is normal. No respiratory distress.  ?Abdominal:  ?   General: There is no distension.  ?   Palpations: Abdomen is soft.  ?Musculoskeletal:     ?   General: Tenderness present.  ?   Cervical back: Neck supple.  ?   Right lower leg: No edema.  ?   Left lower leg: No edema.  ?   Comments: Patient has good distal strength with no pain over the greater trochanters.  No clonus or focal weakness.  ?Skin: ?   Findings: No erythema, lesion or rash.  ?Neurological:  ?   General: No focal deficit present.  ?   Mental Status: She is alert and oriented to person, place, and time.  ?   Sensory: No sensory deficit.  ?   Motor: No weakness or abnormal muscle tone.  ?   Coordination: Coordination normal.  ?Psychiatric:     ?   Mood and Affect: Mood normal.     ?   Behavior: Behavior normal.  ?  ? ?Imaging: ?No results found. ?

## 2021-06-25 ENCOUNTER — Ambulatory Visit (AMBULATORY_SURGERY_CENTER): Payer: 59

## 2021-06-25 VITALS — Ht 68.0 in | Wt 198.0 lb

## 2021-06-25 DIAGNOSIS — Z1211 Encounter for screening for malignant neoplasm of colon: Secondary | ICD-10-CM

## 2021-06-25 DIAGNOSIS — Z800 Family history of malignant neoplasm of digestive organs: Secondary | ICD-10-CM

## 2021-06-25 MED ORDER — PEG 3350-KCL-NA BICARB-NACL 420 G PO SOLR: 4000.0000 mL | Freq: Once | ORAL | 0 refills | Status: AC

## 2021-06-25 NOTE — Progress Notes (Signed)
No egg or soy allergy known to patient  ?No issues known to pt with past sedation with any surgeries or procedures ?Patient denies ever being told they had issues or difficulty with intubation  ?No FH of Malignant Hyperthermia ?Pt is not on diet pills ?Pt is not on home 02  ?Pt is not on blood thinners  ?Pt denies issues with constipation at this time;  ?No A fib or A flutter ?NO PA's for preps discussed with pt in PV today  ?Discussed with pt there will be an out-of-pocket cost for prep and that varies from $0 to 70 + dollars - pt verbalized understanding  ?Pt instructed to use Singlecare.com or GoodRx for a price reduction on prep  ?PV completed over the phone. Pt verified name, DOB, address and insurance during PV today.  ?Pt mailed instruction packet with copy of consent form to read and not return, and instructions.  ?Pt encouraged to call with questions or issues.  ?If pt has My chart, procedure instructions sent via My Chart  ?Insurance confirmed with pt at [facility] today  ? ?

## 2021-07-13 ENCOUNTER — Encounter: Payer: 59 | Admitting: Physical Medicine and Rehabilitation

## 2021-07-16 ENCOUNTER — Encounter: Payer: Self-pay | Admitting: Gastroenterology

## 2021-07-16 ENCOUNTER — Ambulatory Visit (AMBULATORY_SURGERY_CENTER): Payer: 59 | Admitting: Gastroenterology

## 2021-07-16 VITALS — BP 131/68 | HR 66 | Temp 97.1°F | Resp 11 | Ht 68.0 in | Wt 198.0 lb

## 2021-07-16 DIAGNOSIS — Z1211 Encounter for screening for malignant neoplasm of colon: Secondary | ICD-10-CM

## 2021-07-16 DIAGNOSIS — D122 Benign neoplasm of ascending colon: Secondary | ICD-10-CM

## 2021-07-16 DIAGNOSIS — Z800 Family history of malignant neoplasm of digestive organs: Secondary | ICD-10-CM

## 2021-07-16 MED ORDER — SODIUM CHLORIDE 0.9 % IV SOLN: 500.0000 mL | Freq: Once | INTRAVENOUS | Status: DC

## 2021-07-16 NOTE — Progress Notes (Signed)
PT taken to PACU. Monitors in place. VSS. Report given to RN. 

## 2021-07-16 NOTE — Patient Instructions (Signed)
Handout on polyps given to you today  Await pathology results on polyp removed     YOU HAD AN ENDOSCOPIC PROCEDURE TODAY AT THE [facility]:   Refer to the procedure report that was given to you for any specific questions about what was found during the examination.  If the procedure report does not answer your questions, please call your gastroenterologist to clarify.  If you requested that your care partner not be given the details of your procedure findings, then the procedure report has been included in a sealed envelope for you to review at your convenience later.  YOU SHOULD EXPECT: Some feelings of bloating in the abdomen. Passage of more gas than usual.  Walking can help get rid of the air that was put into your GI tract during the procedure and reduce the bloating. If you had a lower endoscopy (such as a colonoscopy or flexible sigmoidoscopy) you may notice spotting of blood in your stool or on the toilet paper. If you underwent a bowel prep for your procedure, you may not have a normal bowel movement for a few days.  Please Note:  You might notice some irritation and congestion in your nose or some drainage.  This is from the oxygen used during your procedure.  There is no need for concern and it should clear up in a day or so.  SYMPTOMS TO REPORT IMMEDIATELY:   Following lower endoscopy (colonoscopy or flexible sigmoidoscopy):  Excessive amounts of blood in the stool  Significant tenderness or worsening of abdominal pains  Swelling of the abdomen that is new, acute  Fever of 100F or higher    For urgent or emergent issues, a gastroenterologist can be reached at any hour by calling [phone]. Do not use MyChart messaging for urgent concerns.    DIET:  We do recommend a small meal at first, but then you may proceed to your regular diet.  Drink plenty of fluids but you should avoid alcoholic beverages for 24 hours.  ACTIVITY:  You should plan to take it easy  for the rest of today and you should NOT DRIVE or use heavy machinery until tomorrow (because of the sedation medicines used during the test).    FOLLOW UP: Our staff will call the number listed on your records 48-72 hours following your procedure to check on you and address any questions or concerns that you may have regarding the information given to you following your procedure. If we do not reach you, we will leave a message.  We will attempt to reach you two times.  During this call, we will ask if you have developed any symptoms of COVID 19. If you develop any symptoms (ie: fever, flu-like symptoms, shortness of breath, cough etc.) before then, please call [phone].  If you test positive for Covid 19 in the 2 weeks post procedure, please call and report this information to us.    If any biopsies were taken you will be contacted by phone or by letter within the next 1-3 weeks.  Please call us at [phone] if you have not heard about the biopsies in 3 weeks.    SIGNATURES/CONFIDENTIALITY: You and/or your care partner have signed paperwork which will be entered into your electronic medical record.  These signatures attest to the fact that that the information above on your After Visit Summary has been reviewed and is understood.  Full responsibility of the confidentiality of this discharge information lies with you and/or your   care-partner.  

## 2021-07-16 NOTE — Progress Notes (Signed)
Vitals-DT  Pt's states no medical or surgical changes since previsit or office visit.  

## 2021-07-16 NOTE — Progress Notes (Signed)
Called to room to assist during endoscopic procedure.  Patient ID and intended procedure confirmed with present staff. Received instructions for my participation in the procedure from the performing physician.  

## 2021-07-16 NOTE — Op Note (Signed)
[place] Patient Name: Annette Rojas Procedure Date: 07/16/2021 12:26 PM MRN: [id number] Endoscopist: Thornton Park MD, MD Age: 56 Referring MD:  Date of Birth: Nov 13, 1965 Gender: Female Account #: [id number] Procedure:                Colonoscopy Indications:              Screening for colorectal malignant neoplasm, This                            is the patient's first colonoscopy                           Mother with colon cancer at age 64                           Maternal aunt and maternal uncle with colon cancer Medicines:                Monitored Anesthesia Care Procedure:                Pre-Anesthesia Assessment:                           - Prior to the procedure, a History and Physical                            was performed, and patient medications and                            allergies were reviewed. The patient's tolerance of                            previous anesthesia was also reviewed. The risks                            and benefits of the procedure and the sedation                            options and risks were discussed with the patient.                            All questions were answered, and informed consent                            was obtained. Prior Anticoagulants: The patient has                            taken no previous anticoagulant or antiplatelet                            agents. ASA Grade Assessment: II - A patient with                            mild systemic disease. After reviewing the risks  and benefits, the patient was deemed in                            satisfactory condition to undergo the procedure.                           After obtaining informed consent, the colonoscope                            was passed under direct vision. Throughout the                            procedure, the patient's blood pressure, pulse, and                            oxygen saturations were monitored  continuously. The                            Olympus CF-HQ190L [id number]) Colonoscope was                            introduced through the anus and advanced to the 2                            cm into the ileum. A second forward view of the                            right colon was performed. The colonoscopy was                            performed without difficulty. The patient tolerated                            the procedure well. The quality of the bowel                            preparation was good. The terminal ileum, ileocecal                            valve, appendiceal orifice, and rectum were                            photographed. Scope In: 1:37:14 PM Scope Out: 1:50:17 PM Scope Withdrawal Time: 0 hours 10 minutes 7 seconds  Total Procedure Duration: 0 hours 13 minutes 3 seconds  Findings:                 The perianal and digital rectal examinations were                            normal.                           A less than 1 mm polyp was found in the distal  ascending colon. The polyp was flat. The polyp was                            removed with a cold biopsy forceps. Resection and                            retrieval were complete. Estimated blood loss was                            minimal.                           The exam was otherwise without abnormality. Complications:            No immediate complications. Estimated Blood Loss:     Estimated blood loss was minimal. Impression:               - One less than 1 mm polyp in the distal ascending                            colon, removed with a cold biopsy forceps. Resected                            and retrieved.                           - The examination was otherwise normal. Recommendation:           - Patient has a contact number available for                            emergencies. The signs and symptoms of potential                            delayed complications were  discussed with the                            patient. Return to normal activities tomorrow.                            Written discharge instructions were provided to the                            patient.                           - Resume previous diet.                           - Continue present medications.                           - Await pathology results.                           - Regardless of pathology, repeat colonoscopy in 5  years for surveillance given the family history.                           - Emerging evidence supports eating a diet of                            fruits, vegetables, grains, calcium, and yogurt                            while reducing red meat and alcohol may reduce the                            risk of colon cancer.                           - Thank you for allowing me to be involved in your                            colon cancer prevention. Thornton Park MD, MD 07/16/2021 1:55:41 PM This report has been signed electronically.

## 2021-07-16 NOTE — Progress Notes (Addendum)
Referring Provider: Albina Billet, MD Primary Care Physician:  Albina Billet, MD  Indication for Procedure:  Colon cancer screening   IMPRESSION:  Need for colon cancer screening Family history of colon cancer (mother at age 58, maternal aunt and uncle) Appropriate candidate for monitored anesthesia care  PLAN: Colonoscopy in the [place] today   HPI: Annette Rojas is a 56 y.o. female presents for screening colonoscopy.  No prior colonoscopy or colon cancer screening.  No baseline GI symptoms.   Mother with colon cancer and colon polyps at age 8.  Maternal aunt and uncle with colon cancer.  No other known family history of colon cancer or polyps. No family history of uterine/endometrial cancer, pancreatic cancer or gastric/stomach cancer.   Past Medical History:  Diagnosis Date   Anxiety    on meds   Arthritis    lower back and sciatica   Depression    on meds   Hyperlipidemia    on meds   Hypertension    migraines/HTN   Migraines     Past Surgical History:  Procedure Laterality Date   BREAST BIOPSY Right 01/13/2021   [place] bx, heart marker, path pending    Current Outpatient Medications  Medication Sig Dispense Refill   ALPRAZolam (XANAX) 0.25 MG tablet Take 0.25 mg by mouth as needed for anxiety.     atenolol (TENORMIN) 25 MG tablet Take 25 mg by mouth at bedtime.     diphenhydrAMINE HCl (BENADRYL PO) Take 1 tablet by mouth daily as needed.     FLUoxetine (PROZAC) 20 MG capsule Take 20 mg by mouth daily.     MELATONIN PO Take 1 tablet by mouth at bedtime.     rosuvastatin (CRESTOR) 10 MG tablet Take 1 tablet by mouth daily at 6 (six) AM.     ibuprofen (ADVIL) 600 MG tablet Take 600 mg by mouth every 6 (six) hours as needed.     methocarbamol (ROBAXIN) 750 MG tablet Take 750 mg by mouth every 8 (eight) hours.     Current Facility-Administered Medications  Medication Dose Route Frequency Provider Last Rate Last Admin   0.9 %  sodium chloride infusion  500  mL Intravenous Once Thornton Park, MD        Allergies as of 07/16/2021   (No Known Allergies)    Family History  Problem Relation Age of Onset   Colon polyps Mother 25   Colon cancer Mother 67   Colon cancer Maternal Aunt    Colon cancer Maternal Uncle    Breast cancer Paternal Aunt 58   Breast cancer Paternal Aunt 29   Breast cancer Cousin 68       maternal side   Esophageal cancer Neg Hx    Stomach cancer Neg Hx    Rectal cancer Neg Hx      Physical Exam: General:   Alert,  well-nourished, pleasant and cooperative in NAD Head:  Normocephalic and atraumatic. Eyes:  Sclera clear, no icterus.   Conjunctiva pink. Mouth:  No deformity or lesions.   Neck:  Supple; no masses or thyromegaly. Lungs:  Clear throughout to auscultation.   No wheezes. Heart:  Regular rate and rhythm; no murmurs. Abdomen:  Soft, non-tender, nondistended, normal bowel sounds, no rebound or guarding.  Msk:  Symmetrical. No boney deformities LAD: No inguinal or umbilical LAD Extremities:  No clubbing or edema. Neurologic:  Alert and  oriented x4;  grossly nonfocal Skin:  No obvious rash or bruise. Psych:  Alert and cooperative. Normal mood and affect.     Studies/Results: No results found.    Javaris Wigington L. Tarri Glenn, MD, MPH 07/16/2021, 1:09 PM

## 2021-07-19 ENCOUNTER — Ambulatory Visit: Payer: 59 | Admitting: Physical Medicine and Rehabilitation

## 2021-07-19 ENCOUNTER — Telehealth: Payer: Self-pay | Admitting: *Deleted

## 2021-07-19 ENCOUNTER — Ambulatory Visit: Payer: Self-pay

## 2021-07-19 ENCOUNTER — Encounter: Payer: Self-pay | Admitting: Physical Medicine and Rehabilitation

## 2021-07-19 VITALS — BP 124/78 | HR 76

## 2021-07-19 DIAGNOSIS — M5416 Radiculopathy, lumbar region: Secondary | ICD-10-CM | POA: Diagnosis not present

## 2021-07-19 MED ORDER — METHYLPREDNISOLONE ACETATE 80 MG/ML IJ SUSP
80.0000 mg | Freq: Once | INTRAMUSCULAR | Status: AC
  Administered 2021-07-19: 80 mg

## 2021-07-19 NOTE — Patient Instructions (Signed)

## 2021-07-19 NOTE — Telephone Encounter (Signed)
  Follow up Call-     07/16/2021   12:53 PM  Call back number  Post procedure Call Back phone  # [phone]  Permission to leave phone message Yes     Patient questions:  Do you have a fever, pain , or abdominal swelling? No. Pain Score  0 *  Have you tolerated food without any problems? Yes.    Have you been able to return to your normal activities? Yes.    Do you have any questions about your discharge instructions: Diet   No. Medications  No. Follow up visit  No.  Do you have questions or concerns about your Care? No.  Actions: * If pain score is 4 or above: No action needed, pain <4.

## 2021-07-19 NOTE — Progress Notes (Signed)
Pt state lower back pain that travels down her right leg. Pt state walking, standing and laying down makes the pain worse. Pt state she takes over the counter pain meds and uses heat to help ease her pain  Numeric Pain Rating Scale and Functional Assessment Average Pain 4   In the last MONTH (on 0-10 scale) has pain interfered with the following?  1. General activity like being  able to carry out your everyday physical activities such as walking, climbing stairs, carrying groceries, or moving a chair?  Rating(8)   +Driver, -BT, -Dye Allergies.

## 2021-07-21 ENCOUNTER — Encounter: Payer: Self-pay | Admitting: Gastroenterology

## 2021-07-28 NOTE — Procedures (Signed)
S1 Lumbosacral Transforaminal Epidural Steroid Injection - Sub-Pedicular Approach with Fluoroscopic Guidance   Patient: Annette Rojas      Date of Birth: 1965/03/30 MRN: [id number] PCP: Albina Billet, MD      Visit Date: 07/19/2021   Universal Protocol:    Date/Time: 05/31/237:08 PM  Consent Given By: the patient  Position:  PRONE  Additional Comments: Vital signs were monitored before and after the procedure. Patient was prepped and draped in the usual sterile fashion. The correct patient, procedure, and site was verified.   Injection Procedure Details:  Procedure Site One Meds Administered:  Meds ordered this encounter  Medications   methylPREDNISolone acetate (DEPO-MEDROL) injection 80 mg    Laterality: Right  Location/Site:  S1 Foramen   Needle size: 22 ga.  Needle type: Spinal  Needle Placement: Transforaminal  Findings:   -Comments: Excellent flow of contrast along the nerve, nerve root and into the epidural space.  Epidurogram: Contrast epidurogram showed no nerve root cut off or restricted flow pattern.  Procedure Details: After squaring off the sacral end-plate to get a true AP view, the C-arm was positioned so that the best possible view of the S1 foramen was visualized. The soft tissues overlying this structure were infiltrated with 2-3 ml. of 1% Lidocaine without Epinephrine.    The spinal needle was inserted toward the target using a "trajectory" view along the fluoroscope beam.  Under AP and lateral visualization, the needle was advanced so it did not puncture dura. Biplanar projections were used to confirm position. Aspiration was confirmed to be negative for CSF and/or blood. A 1-2 ml. volume of Isovue-250 was injected and flow of contrast was noted at each level. Radiographs were obtained for documentation purposes.   After attaining the desired flow of contrast documented above, a 0.5 to 1.0 ml test dose of 0.25% Marcaine was injected into  each respective transforaminal space.  The patient was observed for 90 seconds post injection.  After no sensory deficits were reported, and normal lower extremity motor function was noted,   the above injectate was administered so that equal amounts of the injectate were placed at each foramen (level) into the transforaminal epidural space.   Additional Comments:  The patient tolerated the procedure well Dressing: Band-Aid with 2 x 2 sterile gauze    Post-procedure details: Patient was observed during the procedure. Post-procedure instructions were reviewed.  Patient left the clinic in stable condition.

## 2021-07-28 NOTE — Progress Notes (Signed)
Annette Rojas - 56 y.o. female MRN [id number]  Date of birth: 1965-09-05  Office Visit Note: Visit Date: 07/19/2021 PCP: Albina Billet, MD Referred by: Albina Billet, MD  Subjective: Chief Complaint  Patient presents with   Lower Back - Pain   Right Leg - Pain   HPI:  Annette Rojas is a 56 y.o. female who comes in today at the request of [place], PA-C for planned Right S1-2 Lumbar Transforaminal epidural steroid injection with fluoroscopic guidance.  The patient has failed conservative care including home exercise, medications, time and activity modification.  This injection will be diagnostic and hopefully therapeutic.  Please see requesting physician notes for further details and justification.  ROS Otherwise per HPI.  Assessment & Plan: Visit Diagnoses:    ICD-10-CM   1. Lumbar radiculopathy  M54.16 XR C-ARM NO REPORT    Epidural Steroid injection    methylPREDNISolone acetate (DEPO-MEDROL) injection 80 mg      Plan: No additional findings.   Meds & Orders:  Meds ordered this encounter  Medications   methylPREDNISolone acetate (DEPO-MEDROL) injection 80 mg    Orders Placed This Encounter  Procedures   XR C-ARM NO REPORT   Epidural Steroid injection    Follow-up: Return for visit to requesting provider as needed.   Procedures: No procedures performed  S1 Lumbosacral Transforaminal Epidural Steroid Injection - Sub-Pedicular Approach with Fluoroscopic Guidance   Patient: Annette Rojas      Date of Birth: 1965-08-01 MRN: [id number] PCP: Albina Billet, MD      Visit Date: 07/19/2021   Universal Protocol:    Date/Time: 05/31/237:08 PM  Consent Given By: the patient  Position:  PRONE  Additional Comments: Vital signs were monitored before and after the procedure. Patient was prepped and draped in the usual sterile fashion. The correct patient, procedure, and site was verified.   Injection Procedure Details:  Procedure Site  One Meds Administered:  Meds ordered this encounter  Medications   methylPREDNISolone acetate (DEPO-MEDROL) injection 80 mg    Laterality: Right  Location/Site:  S1 Foramen   Needle size: 22 ga.  Needle type: Spinal  Needle Placement: Transforaminal  Findings:   -Comments: Excellent flow of contrast along the nerve, nerve root and into the epidural space.  Epidurogram: Contrast epidurogram showed no nerve root cut off or restricted flow pattern.  Procedure Details: After squaring off the sacral end-plate to get a true AP view, the C-arm was positioned so that the best possible view of the S1 foramen was visualized. The soft tissues overlying this structure were infiltrated with 2-3 ml. of 1% Lidocaine without Epinephrine.    The spinal needle was inserted toward the target using a "trajectory" view along the fluoroscope beam.  Under AP and lateral visualization, the needle was advanced so it did not puncture dura. Biplanar projections were used to confirm position. Aspiration was confirmed to be negative for CSF and/or blood. A 1-2 ml. volume of Isovue-250 was injected and flow of contrast was noted at each level. Radiographs were obtained for documentation purposes.   After attaining the desired flow of contrast documented above, a 0.5 to 1.0 ml test dose of 0.25% Marcaine was injected into each respective transforaminal space.  The patient was observed for 90 seconds post injection.  After no sensory deficits were reported, and normal lower extremity motor function was noted,   the above injectate was administered so that equal amounts of the injectate were placed  at each foramen (level) into the transforaminal epidural space.   Additional Comments:  The patient tolerated the procedure well Dressing: Band-Aid with 2 x 2 sterile gauze    Post-procedure details: Patient was observed during the procedure. Post-procedure instructions were reviewed.  Patient left the clinic in  stable condition.   Clinical History: MRI LUMBAR SPINE WITHOUT CONTRAST   TECHNIQUE: Multiplanar, multisequence MR imaging of the lumbar spine was performed. No intravenous contrast was administered.   COMPARISON:  Lumbar spine radiograph dated April 20, 2021   FINDINGS: Segmentation:  Standard.   Alignment:  Mild retrolisthesis of L2, L3, L4 and L5.   Vertebrae:  No fracture, evidence of discitis, or bone lesion.   Conus medullaris and cauda equina: Conus extends to the L1 level. Conus and cauda equina appear normal.   Paraspinal and other soft tissues: Negative.   Disc levels:   T12-L1: Asymmetric disc protrusion to the right without significant spinal canal or neural foraminal narrowing mild facet joint arthropathy.   L1-L2: No significant disc bulge. No neural foraminal stenosis. No central canal stenosis. Mild facet joint arthropathy.   L2-L3: Circumferential disc protrusion and ligamentum flavum hypertrophy with mild narrowing of subarticular recess. Mild facet joint arthropathy. No significant spinal canal or neural foraminal narrowing.   L3-L4: Mild circumferential disc protrusion with narrowing of lateral recesses. Ligamentum flavum hypertrophy. Moderate bilateral facet joint arthropathy. No significant neural foraminal narrowing.   L4-L5: Disc protrusion with bilateral subarticular recess narrowing. Mild ligamentum flavum hypertrophy. Moderate facet joint arthropathy. No significant neural foraminal narrowing.   L5-S1: Moderate to severe disc protrusion and ligamentum flavum hypertrophy with narrowing of spinal canal. Bilateral lateral recess narrowing with encroachment of the S1 nerve roots bilaterally. Mild bilateral neural foraminal narrowing at L5. Moderate to severe facet joint arthropathy.   IMPRESSION: 1.  No evidence of acute fracture or subluxation.   2. Moderate multilevel degenerative disc disease most prominent at L5-S1 with narrowing of  bilateral recesses and encroachment of the S1 roots as well as mild bilateral neural foraminal narrowing at L5.   3. Advanced multilevel facet joint arthropathy prominent at L4-L5 and L5-S1.   5.  Distal spinal cord and cauda equina are within normal limits.     Electronically Signed   By: Keane Police D.O.   On: 05/02/2021 17:35     Objective:  VS:  HT:    WT:   BMI:     BP:124/78  HR:76bpm  TEMP: ( )  RESP:  Physical Exam Vitals and nursing note reviewed.  Constitutional:      General: She is not in acute distress.    Appearance: Normal appearance. She is not ill-appearing.  HENT:     Head: Normocephalic and atraumatic.     Right Ear: External ear normal.     Left Ear: External ear normal.  Eyes:     Extraocular Movements: Extraocular movements intact.  Cardiovascular:     Rate and Rhythm: Normal rate.     Pulses: Normal pulses.  Pulmonary:     Effort: Pulmonary effort is normal. No respiratory distress.  Abdominal:     General: There is no distension.     Palpations: Abdomen is soft.  Musculoskeletal:        General: Tenderness present.     Cervical back: Neck supple.     Right lower leg: No edema.     Left lower leg: No edema.     Comments: Patient has good distal strength with  no pain over the greater trochanters.  No clonus or focal weakness.  Skin:    Findings: No erythema, lesion or rash.  Neurological:     General: No focal deficit present.     Mental Status: She is alert and oriented to person, place, and time.     Sensory: No sensory deficit.     Motor: No weakness or abnormal muscle tone.     Coordination: Coordination normal.  Psychiatric:        Mood and Affect: Mood normal.        Behavior: Behavior normal.     Imaging: No results found.

## 2021-09-27 ENCOUNTER — Telehealth: Payer: Self-pay | Admitting: Physical Medicine and Rehabilitation

## 2021-09-27 NOTE — Telephone Encounter (Signed)
Patient request a call back to schedule an appointment.    Call back [phone]

## 2021-09-28 ENCOUNTER — Telehealth: Payer: Self-pay | Admitting: Physical Medicine and Rehabilitation

## 2021-09-28 NOTE — Telephone Encounter (Signed)
Patient called wanting to make an appointment for a back injection. She has [employer] plan which goes away the end of this month and would like to make sure she can be seen and have it approved before she loses her insurance. If she could get a call back at [phone]

## 2021-10-19 ENCOUNTER — Ambulatory Visit: Payer: 59 | Admitting: Physical Medicine and Rehabilitation

## 2021-10-19 ENCOUNTER — Ambulatory Visit: Payer: Self-pay

## 2021-10-19 ENCOUNTER — Encounter: Payer: Self-pay | Admitting: Physical Medicine and Rehabilitation

## 2021-10-19 VITALS — BP 132/82 | HR 76

## 2021-10-19 DIAGNOSIS — M5416 Radiculopathy, lumbar region: Secondary | ICD-10-CM

## 2021-10-19 MED ORDER — METHYLPREDNISOLONE ACETATE 80 MG/ML IJ SUSP
40.0000 mg | Freq: Once | INTRAMUSCULAR | Status: AC
  Administered 2021-10-19: 40 mg

## 2021-10-19 NOTE — Progress Notes (Signed)
Pt state lower back pain that travels down her right leg. Pt state walking, standing and laying down makes the pain worse. Pt state she takes over the counter pain meds and uses heat to help ease her pain.  Numeric Pain Rating Scale and Functional Assessment Average Pain 6   In the last MONTH (on 0-10 scale) has pain interfered with the following?  1. General activity like being  able to carry out your everyday physical activities such as walking, climbing stairs, carrying groceries, or moving a chair?  Rating(10)   +Driver, -BT, -Dye Allergies.

## 2021-10-19 NOTE — Patient Instructions (Signed)

## 2021-10-19 NOTE — Progress Notes (Signed)
Annette Rojas - 56 y.o. female MRN [id number]  Date of birth: [birth year]  Office Visit Note: Visit Date: 10/19/2021 PCP: Albina Billet, MD Referred by: Albina Billet, MD  Subjective: Chief Complaint  Patient presents with   Lower Back - Pain   Right Leg - Pain   HPI:  Annette Rojas is a 56 y.o. female who comes in today for planned repeat Right L5-S1  Lumbar Interlaminar epidural steroid injection with fluoroscopic guidance.  The patient has failed conservative care including home exercise, medications, time and activity modification.  This injection will be diagnostic and hopefully therapeutic.  Please see requesting physician notes for further details and justification. Patient received more than 50% pain relief from prior injection.   Referring: Bevely Palmer Persons, PA-C  We are going to put in a request for physical therapy.  She would like to go somewhere in [place] closer to her home.  I did hand write a prescription for physical therapy for her.  We also discussed medication uses which I did tell her to use Tylenol Extra Strength 1 tablet 3 times a day scheduled as well as 1 tablet of Aleve twice a day scheduled for now.  She is not to take bath and body Bayer or ibuprofen with that.  We discussed the appropriate use of opioid medications.  We will see her back in 4 weeks or so if needed she will call us.  Plan would be possibly to look at facet joint blocks if she just was not getting much relief.   ROS Otherwise per HPI.  Assessment & Plan: Visit Diagnoses:    ICD-10-CM   1. Lumbar radiculopathy  M54.16 XR C-ARM NO REPORT    Epidural Steroid injection    methylPREDNISolone acetate (DEPO-MEDROL) injection 40 mg      Plan: No additional findings.   Meds & Orders:  Meds ordered this encounter  Medications   methylPREDNISolone acetate (DEPO-MEDROL) injection 40 mg    Orders Placed This Encounter  Procedures   XR C-ARM NO REPORT   Epidural Steroid injection     Follow-up: Return for visit to requesting provider as needed.   Procedures: No procedures performed  Lumbar Epidural Steroid Injection - Interlaminar Approach with Fluoroscopic Guidance  Patient: Annette Rojas      Date of Birth: 21-Nov-1965 MRN: [id number] PCP: Albina Billet, MD      Visit Date: 10/19/2021   Universal Protocol:     Consent Given By: the patient  Position: PRONE  Additional Comments: Vital signs were monitored before and after the procedure. Patient was prepped and draped in the usual sterile fashion. The correct patient, procedure, and site was verified.   Injection Procedure Details:   Procedure diagnoses: Lumbar radiculopathy [M54.16]   Meds Administered:  Meds ordered this encounter  Medications   methylPREDNISolone acetate (DEPO-MEDROL) injection 40 mg     Laterality: Right  Location/Site:  L5-S1  Needle: 3.5 in., 20 ga. Tuohy  Needle Placement: Paramedian epidural  Findings:   -Comments: Excellent flow of contrast into the epidural space.  Procedure Details: Using a paramedian approach from the side mentioned above, the region overlying the inferior lamina was localized under fluoroscopic visualization and the soft tissues overlying this structure were infiltrated with 4 ml. of 1% Lidocaine without Epinephrine. The Tuohy needle was inserted into the epidural space using a paramedian approach.   The epidural space was localized using loss of resistance along with counter oblique bi-planar fluoroscopic views.  After negative aspirate for air, blood, and CSF, a 2 ml. volume of Isovue-250 was injected into the epidural space and the flow of contrast was observed. Radiographs were obtained for documentation purposes.    The injectate was administered into the level noted above.   Additional Comments:  The patient tolerated the procedure well Dressing: 2 x 2 sterile gauze and Band-Aid    Post-procedure details: Patient was observed  during the procedure. Post-procedure instructions were reviewed.  Patient left the clinic in stable condition.   Clinical History: MRI LUMBAR SPINE WITHOUT CONTRAST   TECHNIQUE: Multiplanar, multisequence MR imaging of the lumbar spine was performed. No intravenous contrast was administered.   COMPARISON:  Lumbar spine radiograph dated April 20, 2021   FINDINGS: Segmentation:  Standard.   Alignment:  Mild retrolisthesis of L2, L3, L4 and L5.   Vertebrae:  No fracture, evidence of discitis, or bone lesion.   Conus medullaris and cauda equina: Conus extends to the L1 level. Conus and cauda equina appear normal.   Paraspinal and other soft tissues: Negative.   Disc levels:   T12-L1: Asymmetric disc protrusion to the right without significant spinal canal or neural foraminal narrowing mild facet joint arthropathy.   L1-L2: No significant disc bulge. No neural foraminal stenosis. No central canal stenosis. Mild facet joint arthropathy.   L2-L3: Circumferential disc protrusion and ligamentum flavum hypertrophy with mild narrowing of subarticular recess. Mild facet joint arthropathy. No significant spinal canal or neural foraminal narrowing.   L3-L4: Mild circumferential disc protrusion with narrowing of lateral recesses. Ligamentum flavum hypertrophy. Moderate bilateral facet joint arthropathy. No significant neural foraminal narrowing.   L4-L5: Disc protrusion with bilateral subarticular recess narrowing. Mild ligamentum flavum hypertrophy. Moderate facet joint arthropathy. No significant neural foraminal narrowing.   L5-S1: Moderate to severe disc protrusion and ligamentum flavum hypertrophy with narrowing of spinal canal. Bilateral lateral recess narrowing with encroachment of the S1 nerve roots bilaterally. Mild bilateral neural foraminal narrowing at L5. Moderate to severe facet joint arthropathy.   IMPRESSION: 1.  No evidence of acute fracture or  subluxation.   2. Moderate multilevel degenerative disc disease most prominent at L5-S1 with narrowing of bilateral recesses and encroachment of the S1 roots as well as mild bilateral neural foraminal narrowing at L5.   3. Advanced multilevel facet joint arthropathy prominent at L4-L5 and L5-S1.   5.  Distal spinal cord and cauda equina are within normal limits.     Electronically Signed   By: Keane Police D.O.   On: 05/02/2021 17:35     Objective:  VS:  HT:    WT:   BMI:     BP:132/82  HR:76bpm  TEMP: ( )  RESP:  Physical Exam Vitals and nursing note reviewed.  Constitutional:      General: She is not in acute distress.    Appearance: Normal appearance. She is not ill-appearing.  HENT:     Head: Normocephalic and atraumatic.     Right Ear: External ear normal.     Left Ear: External ear normal.  Eyes:     Extraocular Movements: Extraocular movements intact.  Cardiovascular:     Rate and Rhythm: Normal rate.     Pulses: Normal pulses.  Pulmonary:     Effort: Pulmonary effort is normal. No respiratory distress.  Abdominal:     General: There is no distension.     Palpations: Abdomen is soft.  Musculoskeletal:        General: Tenderness present.  Cervical back: Neck supple.     Right lower leg: No edema.     Left lower leg: No edema.     Comments: Patient has good distal strength with no pain over the greater trochanters.  No clonus or focal weakness.  Skin:    Findings: No erythema, lesion or rash.  Neurological:     General: No focal deficit present.     Mental Status: She is alert and oriented to person, place, and time.     Sensory: No sensory deficit.     Motor: No weakness or abnormal muscle tone.     Coordination: Coordination normal.  Psychiatric:        Mood and Affect: Mood normal.        Behavior: Behavior normal.      Imaging: No results found.

## 2021-10-19 NOTE — Procedures (Signed)
Lumbar Epidural Steroid Injection - Interlaminar Approach with Fluoroscopic Guidance  Patient: Annette Rojas      Date of Birth: 28-May-1965 MRN: [id number] PCP: Albina Billet, MD      Visit Date: 10/19/2021   Universal Protocol:     Consent Given By: the patient  Position: PRONE  Additional Comments: Vital signs were monitored before and after the procedure. Patient was prepped and draped in the usual sterile fashion. The correct patient, procedure, and site was verified.   Injection Procedure Details:   Procedure diagnoses: Lumbar radiculopathy [M54.16]   Meds Administered:  Meds ordered this encounter  Medications   methylPREDNISolone acetate (DEPO-MEDROL) injection 40 mg     Laterality: Right  Location/Site:  L5-S1  Needle: 3.5 in., 20 ga. Tuohy  Needle Placement: Paramedian epidural  Findings:   -Comments: Excellent flow of contrast into the epidural space.  Procedure Details: Using a paramedian approach from the side mentioned above, the region overlying the inferior lamina was localized under fluoroscopic visualization and the soft tissues overlying this structure were infiltrated with 4 ml. of 1% Lidocaine without Epinephrine. The Tuohy needle was inserted into the epidural space using a paramedian approach.   The epidural space was localized using loss of resistance along with counter oblique bi-planar fluoroscopic views.  After negative aspirate for air, blood, and CSF, a 2 ml. volume of Isovue-250 was injected into the epidural space and the flow of contrast was observed. Radiographs were obtained for documentation purposes.    The injectate was administered into the level noted above.   Additional Comments:  The patient tolerated the procedure well Dressing: 2 x 2 sterile gauze and Band-Aid    Post-procedure details: Patient was observed during the procedure. Post-procedure instructions were reviewed.  Patient left the clinic in stable  condition.

## 2021-11-02 ENCOUNTER — Encounter: Payer: Self-pay | Admitting: Physical Medicine and Rehabilitation

## 2021-11-03 ENCOUNTER — Other Ambulatory Visit: Payer: Self-pay | Admitting: Physical Medicine and Rehabilitation

## 2021-11-03 DIAGNOSIS — G8929 Other chronic pain: Secondary | ICD-10-CM

## 2021-11-03 DIAGNOSIS — M5416 Radiculopathy, lumbar region: Secondary | ICD-10-CM

## 2021-11-11 ENCOUNTER — Ambulatory Visit: Payer: Commercial Managed Care - HMO | Admitting: Physical Therapy

## 2021-11-11 ENCOUNTER — Encounter: Payer: Self-pay | Admitting: Physical Therapy

## 2021-11-11 ENCOUNTER — Other Ambulatory Visit: Payer: Self-pay

## 2021-11-11 DIAGNOSIS — M6281 Muscle weakness (generalized): Secondary | ICD-10-CM | POA: Diagnosis not present

## 2021-11-11 DIAGNOSIS — M5459 Other low back pain: Secondary | ICD-10-CM | POA: Diagnosis not present

## 2021-11-11 DIAGNOSIS — M357 Hypermobility syndrome: Secondary | ICD-10-CM

## 2021-11-11 NOTE — Therapy (Signed)
OUTPATIENT PHYSICAL THERAPY THORACOLUMBAR EVALUATION   Patient Name: Annette Rojas MRN: [id number] DOB:04/11/1965, 56 y.o., female Today's Date: 11/11/2021   PT End of Session - 11/11/21 1454     Visit Number 1    Number of Visits 12    Date for PT Re-Evaluation 12/23/21    Authorization Type [employer], 30 visit limit, $25 copay    PT Start Time 1345    PT Stop Time 1425    PT Time Calculation (min) 40 min    Activity Tolerance Patient tolerated treatment well    Behavior During Therapy WFL for tasks assessed/performed             Past Medical History:  Diagnosis Date   Anxiety    on meds   Arthritis    lower back and sciatica   Depression    on meds   Hyperlipidemia    on meds   Hypertension    migraines/HTN   Migraines    Past Surgical History:  Procedure Laterality Date   BREAST BIOPSY Right 01/13/2021   [place] bx, heart marker, path pending   There are no problems to display for this patient.   PCP: Benita Stabile, MD  REFERRING PROVIDER: Lorine Bears, NP   REFERRING DIAG: [phone] (ICD-10-CM) - Lumbar radiculopathy M54.41,G89.29 (ICD-10-CM) - Chronic right-sided low back pain with right-sided sciatica   Rationale for Evaluation and Treatment Rehabilitation  THERAPY DIAG:  Other low back pain - Plan: PT plan of care cert/re-cert  Muscle weakness (generalized) - Plan: PT plan of care cert/re-cert  Hypermobility syndrome - Plan: PT plan of care cert/re-cert  ONSET DATE: Jan/Feb 2023, chronic x a few years  SUBJECTIVE:                                                                                                                                                                                           SUBJECTIVE STATEMENT: Pt is a 56 y/o female who presents to OPPT for LBP.  She had injection which helped with radicular symptoms on Rt, but now has some Lt sided pain as well. She states pain is deep and lower in sacrum.  PERTINENT HISTORY:  Anxiety,  arthritis, depression, HTN, migraines  PAIN:  Are you having pain? Yes: NPRS scale: 3 currently, up to 8, at best 1/10 Pain location: low back Pain description: aching, spasm Aggravating factors: bending forward (repetitive movement) Relieving factors: TENS unit, lumbar pillow in car, injection    PRECAUTIONS: None  WEIGHT BEARING RESTRICTIONS No  FALLS:  Has patient fallen in last 6 months? 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 Moderate multilevel degenerative disc disease most prominent at L5-S1 with narrowing of bilateral recesses and encroachment of the S1 roots as well as mild bilateral neural foraminal narrowing at L5.  PATIENT SURVEYS:  11/11/21: FOTO 42 (predicted 70)  SCREENING FOR RED FLAGS: Bowel or bladder incontinence: No Spinal tumors: No Cauda equina syndrome: No Compression fracture: No Abdominal aneurysm: No  COGNITION: Overall cognitive status: Within functional limits for tasks assessed    SENSATION: WFL  MUSCLE LENGTH: Gross hypermobility noted in bil hips  POSTURE: rounded shoulders and forward head  PALPATION: 11/11/21: pain L4/5 with hypomobility; tenderness bil SIJ  LUMBAR ROM:   Active  A/PROM  eval  Flexion WNL with pain  Extension WNL with incr pain  Right lateral flexion WNL with Lt side pain  Left lateral flexion WNL with pain  Right rotation WNL  Left rotation WNL   (Blank rows = not  tested)   LOWER EXTREMITY MMT:    MMT Right eval Left eval  Hip flexion 3+/5 4/5  Hip extension 3/5 3/5  Hip abduction 3/5 3/5  Knee flexion 5/5 5/5  Knee extension 5/5 5/5   (Blank rows = not tested)  LUMBAR SPECIAL TESTS:  11/11/21 Slump test: Negative   GAIT: 11/11/21 Comments: independent without significant deviations    TODAY'S TREATMENT  11/11/21 See HEP - performed trial reps PRN with mod cues for comprehension and understanding   PATIENT EDUCATION:  Education details: HEP Person educated: Patient Education method: Consulting civil engineer, Demonstration, and Handouts Education comprehension: verbalized understanding, returned demonstration, and needs further education   HOME EXERCISE PROGRAM: Access Code: [email or web address] URL: [email or web address] Date: 11/11/2021 Prepared by: Faustino Congress  Exercises - Supine Bridge with Resistance Band  - 1-2 x daily - 7 x weekly - 2 sets - 10 reps - 5 seconds hold - Hooklying Single Leg Bent Knee Fallouts with Resistance  - 1-2 x daily - 7 x weekly - 2 sets - 10 reps - Supine Posterior Pelvic Tilt  - 2 x daily - 7 x weekly - 1 sets - 10 reps - 5 sec hold - Hooklying Isometric Hip Flexion with Opposite Arm  - 2 x daily - 7 x weekly - 1 sets - 10 reps - 5 sec hold  Patient Education - Trigger Point Dry Needling  ASSESSMENT:  CLINICAL IMPRESSION: Patient is a 56 y.o. female who was seen today for physical therapy evaluation and treatment for low back pain. She demonstrates hip hypermobility, decreased strength, postural abnormalities affecting functional mobility.  She will benefit from PT to address deficits listed.   OBJECTIVE IMPAIRMENTS decreased mobility, decreased strength, hypomobility, increased fascial restrictions, postural dysfunction, pain, and hypermobility .   ACTIVITY LIMITATIONS carrying, lifting, bending, sitting, standing, squatting, transfers, and locomotion level  PARTICIPATION LIMITATIONS: meal  prep, cleaning, laundry, driving, shopping, community activity, and yard work  PERSONAL FACTORS 3+ comorbidities: Anxiety, arthritis, depression, HTN, migraines  are also affecting patient's functional outcome.   REHAB POTENTIAL: Good  CLINICAL DECISION MAKING: Evolving/moderate complexity  EVALUATION COMPLEXITY: Moderate  GOALS: Goals reviewed with patient? Yes  SHORT TERM GOALS: Target date: 12/02/2021  Independent with initial HEP Goal status: INITIAL   LONG TERM GOALS: Target date: 12/23/2021  Independent with final HEP Goal status: INITIAL  2.  FOTO score improved to 57 Goal status: INITIAL  3.  Bil hip strength improved to 4/5 for improved function and mobility Goal status: INIITAL  4.  Report pain < 4/10 with increased activity for improved function Goal status: INITIAL    PLAN: PT FREQUENCY: 2x/week  PT DURATION: 6 weeks  PLANNED INTERVENTIONS: Therapeutic exercises, Therapeutic activity, Neuromuscular re-education, Patient/Family education, Self Care, Joint mobilization, Aquatic Therapy, Dry Needling, Electrical stimulation, Spinal manipulation, Spinal mobilization, Cryotherapy, Moist heat, Taping, Traction, Ultrasound, Ionotophoresis '4mg'$ /ml Dexamethasone, Manual therapy, and Re-evaluation.  PLAN FOR NEXT SESSION: review HEP, possible DN to multifidi, needs hip/core strengthening   Laureen Abrahams, PT, DPT 11/11/21 2:56 PM

## 2021-11-16 ENCOUNTER — Ambulatory Visit: Payer: Commercial Managed Care - HMO | Admitting: Rehabilitative and Restorative Service Providers"

## 2021-11-16 ENCOUNTER — Encounter: Payer: Self-pay | Admitting: Rehabilitative and Restorative Service Providers"

## 2021-11-16 DIAGNOSIS — M357 Hypermobility syndrome: Secondary | ICD-10-CM | POA: Diagnosis not present

## 2021-11-16 DIAGNOSIS — M5459 Other low back pain: Secondary | ICD-10-CM | POA: Diagnosis not present

## 2021-11-16 DIAGNOSIS — M6281 Muscle weakness (generalized): Secondary | ICD-10-CM | POA: Diagnosis not present

## 2021-11-16 NOTE — Therapy (Signed)
OUTPATIENT PHYSICAL THERAPY TREATMENT   Patient Name: Annette Rojas MRN: [id number] DOB:March 26, 1965, 56 y.o., female Today's Date: 11/16/2021  PCP: Benita Stabile, MD  REFERRING PROVIDER: Lorine Bears, NP   END OF SESSION:   PT End of Session - 11/16/21 1127     Visit Number 2    Number of Visits 12    Date for PT Re-Evaluation 12/23/21    Authorization Type [employer], 30 visit limit, $25 copay    PT Start Time 1127    PT Stop Time 1208    PT Time Calculation (min) 41 min    Activity Tolerance Patient tolerated treatment well    Behavior During Therapy WFL for tasks assessed/performed              Past Medical History:  Diagnosis Date   Anxiety    on meds   Arthritis    lower back and sciatica   Depression    on meds   Hyperlipidemia    on meds   Hypertension    migraines/HTN   Migraines    Past Surgical History:  Procedure Laterality Date   BREAST BIOPSY Right 01/13/2021   [place] bx, heart marker, path pending   There are no problems to display for this patient.   REFERRING DIAG: M54.16 (ICD-10-CM) - Lumbar radiculopathy M54.41,G89.29 (ICD-10-CM) - Chronic right-sided low back pain with right-sided sciatica   Rationale for Evaluation and Treatment Rehabilitation  THERAPY DIAG:  Other low back pain  Muscle weakness (generalized)  Hypermobility syndrome  ONSET DATE: Jan/Feb 2023, chronic x a few years  SUBJECTIVE:                                                                                                                                                                                           SUBJECTIVE STATEMENT: Pt indicated feeling complaints of pain c "pushing pelvic floor down".  Pt indicated she can tolerate the others.  Pt indicated that usually not feeling better after the exercise.   PERTINENT HISTORY:  Anxiety, arthritis, depression, HTN, migraines  PAIN:  NPRS scale: 2/10 Pain location: low back Pain description: aching,  spasm Aggravating factors: bending forward (repetitive movement) Relieving factors: TENS unit, lumbar pillow in car, injection    PRECAUTIONS: None  WEIGHT BEARING RESTRICTIONS No  FALLS:  Has patient fallen in last 6 months? 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 Moderate multilevel degenerative disc disease most prominent at L5-S1 with narrowing of bilateral recesses and encroachment of the S1 roots as well as mild bilateral neural foraminal narrowing at L5.  PATIENT SURVEYS:  11/11/2021: FOTO 42 (predicted 57)  SCREENING FOR RED FLAGS: 11/11/2021 Bowel or bladder incontinence: No Spinal tumors: No Cauda equina syndrome: No Compression fracture: No Abdominal aneurysm: No  COGNITION: 11/11/2021 Overall cognitive status: Within functional limits for tasks assessed     SENSATION: 11/11/2021 [facility]  MUSCLE LENGTH: 11/11/2021 Gross hypermobility noted in bil hips  POSTURE:  11/11/2021 rounded shoulders and forward head  PALPATION: 11/11/2021: pain L4/5 with hypomobility; tenderness bil SIJ  LUMBAR ROM:   Active  AROM  11/11/2021 AROM 11/16/2021  Flexion WNL with pain   Extension WNL with incr pain 50% WFL c pain noted.  Repeated x 5 in standing with similar pain but up to 75%  Right lateral flexion WNL with Lt side pain   Left lateral flexion WNL with pain   Right rotation WNL   Left rotation WNL    (Blank rows =  not tested)   LOWER EXTREMITY MMT:    MMT Right 11/11/2021 Left 11/11/2021  Hip flexion 3+/5 4/5  Hip extension 3/5 3/5  Hip abduction 3/5 3/5  Knee flexion 5/5 5/5  Knee extension 5/5 5/5   (Blank rows = not tested)  LUMBAR SPECIAL TESTS:  11/11/2021 Slump test: Negative   GAIT: 11/11/2021 Comments: independent without significant deviations   TODAY'S TREATMENT  11/16/2021: Therex: Standing lumbar extension x 5 c cues for avoiding pain worsening (just move to tightness) Prone opposite arm/leg lift 3 sec hold 2 x 5 bilateral (barely lifting off table, no pain noted) Supine lumbar trunk rotation stretch 15 sec x 3 bilateral Supine bridge 2-3 sec hold x 10  Seated multifidi isometric UE lift into table 5 sec hold x 10 Nustep lvl 6 8 mins UE/LE  Verbal review of existing HEP  Manual: G2 L4, L5 cPA, Lt and Rt uPA (normal mobility today, mild symptoms)    11/11/2021 See HEP - performed trial reps PRN with mod cues for comprehension and understanding   PATIENT EDUCATION:  11/16/2021 Education details: HEP updates Person educated: Patient Education method: Explanation, Demonstration, and Handouts Education comprehension: verbalized understanding, returned demonstration, and needs further education   HOME EXERCISE PROGRAM: Access Code: [email or web address] URL: [email or web address] Date: 11/16/2021 Prepared by: Scot Jun  Exercises - Supine Bridge with Resistance Band  - 1-2 x daily - 7 x weekly - 2 sets - 10 reps - 5 seconds hold - Hooklying Single Leg Bent Knee Fallouts with Resistance  - 1-2 x daily - 7 x weekly - 2 sets - 10 reps - Supine Posterior Pelvic Tilt  - 2 x daily - 7 x weekly - 1 sets - 10 reps - 5 sec hold - Hooklying Isometric Hip Flexion with Opposite Arm  - 2 x daily - 7 x weekly - 1 sets - 10 reps - 5 sec hold - Supine Lower Trunk Rotation  - 2-3 x daily - 7 x weekly - 1 sets - 3-5 reps - 15 hold - Standing Lumbar Extension with Counter  -  3-5 x daily - 7 x weekly - 1 sets -  5-10 reps - Seated Multifidi Isometric  - 2-3 x daily - 7 x weekly - 1 sets - 10 reps - 5-10 hold  ASSESSMENT:  CLINICAL IMPRESSION: Discussion on Pt education on dry needling.  Pt deferred use of it today and may use it in future.   PAIVM assessment today revealed normal mobility c mild symptoms at L4, L5 cPA, uPA bilateral but not hypomobility.  Encouraged movement in HEP to tolerance, no pain increase.    OBJECTIVE IMPAIRMENTS decreased mobility, decreased strength, hypomobility, increased fascial restrictions, postural dysfunction, pain, and hypermobility .   ACTIVITY LIMITATIONS carrying, lifting, bending, sitting, standing, squatting, transfers, and locomotion level  PARTICIPATION LIMITATIONS: meal prep, cleaning, laundry, driving, shopping, community activity, and yard work  PERSONAL FACTORS 3+ comorbidities: Anxiety, arthritis, depression, HTN, migraines  are also affecting patient's functional outcome.   REHAB POTENTIAL: Good  CLINICAL DECISION MAKING: Evolving/moderate complexity  EVALUATION COMPLEXITY: Moderate   GOALS: Goals reviewed with patient? Yes  SHORT TERM GOALS: Target date: 12/02/2021  Independent with initial HEP Goal status: on going - assessed 11/16/2021   LONG TERM GOALS: Target date: 12/23/2021  Independent with final HEP Goal status: INITIAL  2.  FOTO score improved to 57 Goal status: INITIAL  3.  Bil hip strength improved to 4/5 for improved function and mobility Goal status: INIITAL  4.  Report pain < 4/10 with increased activity for improved function Goal status: INITIAL    PLAN: PT FREQUENCY: 2x/week  PT DURATION: 6 weeks  PLANNED INTERVENTIONS: Therapeutic exercises, Therapeutic activity, Neuromuscular re-education, Patient/Family education, Self Care, Joint mobilization, Aquatic Therapy, Dry Needling, Electrical stimulation, Spinal manipulation, Spinal mobilization, Cryotherapy, Moist heat,  Taping, Traction, Ultrasound, Ionotophoresis '4mg'$ /ml Dexamethasone, Manual therapy, and Re-evaluation.  PLAN FOR NEXT SESSION: DN if desired.  Continue to focus on not pain causing movement in HEP  Scot Jun, PT, DPT, OCS, ATC 11/16/21  12:05 PM

## 2021-11-18 ENCOUNTER — Encounter: Payer: Commercial Managed Care - HMO | Admitting: Rehabilitative and Restorative Service Providers"

## 2021-11-23 ENCOUNTER — Ambulatory Visit: Payer: Commercial Managed Care - HMO | Admitting: Rehabilitative and Restorative Service Providers"

## 2021-11-23 ENCOUNTER — Encounter: Payer: Self-pay | Admitting: Rehabilitative and Restorative Service Providers"

## 2021-11-23 DIAGNOSIS — M5459 Other low back pain: Secondary | ICD-10-CM

## 2021-11-23 DIAGNOSIS — M357 Hypermobility syndrome: Secondary | ICD-10-CM | POA: Diagnosis not present

## 2021-11-23 DIAGNOSIS — M6281 Muscle weakness (generalized): Secondary | ICD-10-CM | POA: Diagnosis not present

## 2021-11-23 NOTE — Therapy (Signed)
OUTPATIENT PHYSICAL THERAPY TREATMENT   Patient Name: Annette Rojas MRN: [id number] DOB:1965-06-18, 56 y.o., female Today's Date: 11/23/2021  PCP: Benita Stabile, MD  REFERRING PROVIDER: Lorine Bears, NP   END OF SESSION:   PT End of Session - 11/23/21 1344     Visit Number 3    Number of Visits 12    Date for PT Re-Evaluation 12/23/21    Authorization Type [employer], 30 visit limit, $25 copay    PT Start Time 1344    PT Stop Time 1424    PT Time Calculation (min) 40 min    Activity Tolerance Patient tolerated treatment well    Behavior During Therapy WFL for tasks assessed/performed               Past Medical History:  Diagnosis Date   Anxiety    on meds   Arthritis    lower back and sciatica   Depression    on meds   Hyperlipidemia    on meds   Hypertension    migraines/HTN   Migraines    Past Surgical History:  Procedure Laterality Date   BREAST BIOPSY Right 01/13/2021   [place] bx, heart marker, path pending   There are no problems to display for this patient.   REFERRING DIAG: M54.16 (ICD-10-CM) - Lumbar radiculopathy M54.41,G89.29 (ICD-10-CM) - Chronic right-sided low back pain with right-sided sciatica   Rationale for Evaluation and Treatment Rehabilitation  THERAPY DIAG:  Other low back pain  Muscle weakness (generalized)  Hypermobility syndrome  ONSET DATE: Jan/Feb 2023, chronic x a few years  SUBJECTIVE:                                                                                                                                                                                           SUBJECTIVE STATEMENT: Pt indicated doing some walking at the beach, "doing ok" from back. Didn't like the bed.    PERTINENT HISTORY:  Anxiety, arthritis, depression, HTN, migraines  PAIN:  NPRS scale: 3/10 Pain location: low back - Lt Pain description: aching, spasm Aggravating factors: bending forward (repetitive movement) Relieving factors:  TENS unit, lumbar pillow in car, injection    PRECAUTIONS: None  WEIGHT BEARING RESTRICTIONS No  FALLS:  Has patient fallen in last 6 months? 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 Moderate multilevel degenerative disc disease most prominent at L5-S1 with narrowing of bilateral recesses and encroachment of the S1 roots as well as mild bilateral neural foraminal narrowing at L5.  PATIENT SURVEYS:  11/11/2021: FOTO 42 (predicted 57)  SCREENING FOR RED FLAGS: 11/11/2021 Bowel or bladder incontinence: No Spinal tumors: No Cauda equina syndrome: No Compression fracture: No Abdominal aneurysm: No  COGNITION: 11/11/2021 Overall cognitive status: Within functional limits for tasks assessed     SENSATION: 11/11/2021 [facility]  MUSCLE LENGTH: 11/11/2021 Gross hypermobility noted in bil hips  POSTURE:  11/11/2021 rounded shoulders and forward head  PALPATION: 11/11/2021: pain L4/5 with hypomobility; tenderness bil SIJ  LUMBAR ROM:   Active  AROM  11/11/2021 AROM 11/16/2021 AROM 11/23/2021  Flexion WNL with pain    Extension WNL with incr pain 50% WFL c pain noted.  Repeated x 5 in standing with similar pain but up to 75% 50 % WFL c pain  Right lateral flexion WNL with Lt side pain    Left lateral flexion WNL with pain    Right rotation WNL    Left rotation WNL     (Blank rows = not tested)   LOWER EXTREMITY MMT:    MMT  Right 11/11/2021 Left 11/11/2021  Hip flexion 3+/5 4/5  Hip extension 3/5 3/5  Hip abduction 3/5 3/5  Knee flexion 5/5 5/5  Knee extension 5/5 5/5   (Blank rows = not tested)  LUMBAR SPECIAL TESTS:  11/11/2021 Slump test: Negative   GAIT: 11/11/2021 Comments: independent without significant deviations   TODAY'S TREATMENT  11/23/2021: Therex: Sidelying regional rotation 15 sec x 4, bilateral.  Supine bridge 2-3 sec hold x 10  SKC 15 sec x 3 bilateral  Nustep lvl 6 10 mins UE/LE   Manual: Compression to Lt QL, lumbar paraspinals.  Skilled palpation during dry needling   Dry needling: Previously given handout for education.  Performed today to Lt QL , Lt lower lumbar paraspinals c twitch response noted.  No adverse reaction noted from treatment today.   11/16/2021: Therex: Standing lumbar extension x 5 c cues for avoiding pain worsening (just move to tightness) Prone opposite arm/leg lift 3 sec hold 2 x 5 bilateral (barely lifting off table, no pain noted) Supine lumbar trunk rotation stretch 15 sec x 3 bilateral Supine bridge 2-3 sec hold x 10  Seated multifidi isometric UE lift into table 5 sec hold x 10 Nustep lvl 6 8 mins UE/LE  Verbal review of existing HEP  Manual: G2 L4, L5 cPA, Lt and Rt uPA (normal mobility today, mild symptoms)  11/11/2021 See HEP - performed trial reps PRN with mod cues for comprehension and understanding   PATIENT EDUCATION:  11/16/2021 Education details: HEP updates Person educated: Patient Education method: Explanation, Demonstration, and Handouts Education comprehension: verbalized understanding, returned demonstration, and needs further education   HOME EXERCISE PROGRAM: Access Code: [email or web address] URL: [email or web address] Date: 11/16/2021 Prepared by: Scot Jun  Exercises - Supine Bridge with Resistance Band  - 1-2 x daily - 7 x weekly - 2 sets - 10 reps - 5 seconds hold - Hooklying Single Leg Bent Knee Fallouts  with Resistance  - 1-2 x daily - 7 x weekly - 2 sets - 10 reps - Supine Posterior Pelvic Tilt  - 2 x daily - 7 x  weekly - 1 sets - 10 reps - 5 sec hold - Hooklying Isometric Hip Flexion with Opposite Arm  - 2 x daily - 7 x weekly - 1 sets - 10 reps - 5 sec hold - Supine Lower Trunk Rotation  - 2-3 x daily - 7 x weekly - 1 sets - 3-5 reps - 15 hold - Standing Lumbar Extension with Counter  - 3-5 x daily - 7 x weekly - 1 sets - 5-10 reps - Seated Multifidi Isometric  - 2-3 x daily - 7 x weekly - 1 sets - 10 reps - 5-10 hold  ASSESSMENT:  CLINICAL IMPRESSION: In myofascial trigger point treatment and dry needling, most concordant symptom noted from Lt QL at this time.   Continued complaints of low back pain that impair functional daily activity from PLOF.  Initial response to treatment today was mild improvement in symptoms c lumbar mobility as compared to arrival.    OBJECTIVE IMPAIRMENTS decreased mobility, decreased strength, hypomobility, increased fascial restrictions, postural dysfunction, pain, and hypermobility .   ACTIVITY LIMITATIONS carrying, lifting, bending, sitting, standing, squatting, transfers, and locomotion level  PARTICIPATION LIMITATIONS: meal prep, cleaning, laundry, driving, shopping, community activity, and yard work  PERSONAL FACTORS 3+ comorbidities: Anxiety, arthritis, depression, HTN, migraines  are also affecting patient's functional outcome.   REHAB POTENTIAL: Good  CLINICAL DECISION MAKING: Evolving/moderate complexity  EVALUATION COMPLEXITY: Moderate   GOALS: Goals reviewed with patient? Yes  SHORT TERM GOALS: Target date: 12/02/2021  Independent with initial HEP Goal status: on going - assessed 11/16/2021   LONG TERM GOALS: Target date: 12/23/2021  Independent with final HEP Goal status: on going - assessed 11/23/2021  2.  FOTO score improved to 57 Goal status: on going - assessed 11/23/2021  3.  Bil hip strength improved to 4/5 for improved  function and mobility Goal status: on going - assessed 11/23/2021  4.  Report pain < 4/10 with increased activity for improved function Goal status: on going - assessed 11/23/2021    PLAN: PT FREQUENCY: 2x/week  PT DURATION: 6 weeks  PLANNED INTERVENTIONS: Therapeutic exercises, Therapeutic activity, Neuromuscular re-education, Patient/Family education, Self Care, Joint mobilization, Aquatic Therapy, Dry Needling, Electrical stimulation, Spinal manipulation, Spinal mobilization, Cryotherapy, Moist heat, Taping, Traction, Ultrasound, Ionotophoresis '4mg'$ /ml Dexamethasone, Manual therapy, and Re-evaluation.  PLAN FOR NEXT SESSION: DN as desired per Pt report from today's visit.    Scot Jun, PT, DPT, OCS, ATC 11/23/21  2:22 PM

## 2021-11-25 ENCOUNTER — Encounter: Payer: Self-pay | Admitting: Physical Therapy

## 2021-11-25 ENCOUNTER — Ambulatory Visit: Payer: Commercial Managed Care - HMO | Admitting: Physical Therapy

## 2021-11-25 DIAGNOSIS — M357 Hypermobility syndrome: Secondary | ICD-10-CM

## 2021-11-25 DIAGNOSIS — M5459 Other low back pain: Secondary | ICD-10-CM | POA: Diagnosis not present

## 2021-11-25 DIAGNOSIS — M6281 Muscle weakness (generalized): Secondary | ICD-10-CM

## 2021-11-25 NOTE — Therapy (Signed)
OUTPATIENT PHYSICAL THERAPY TREATMENT   Patient Name: Annette Rojas MRN: [id number] DOB:12-Sep-1965, 56 y.o., female Today's Date: 11/25/2021  PCP: Benita Stabile, MD  REFERRING PROVIDER: Lorine Bears, NP   END OF SESSION:   PT End of Session - 11/25/21 1342     Visit Number 4    Number of Visits 12    Date for PT Re-Evaluation 12/23/21    Authorization Type [employer], 30 visit limit, $25 copay    PT Start Time 1340    PT Stop Time 1420    PT Time Calculation (min) 40 min    Activity Tolerance Patient tolerated treatment well    Behavior During Therapy WFL for tasks assessed/performed                Past Medical History:  Diagnosis Date   Anxiety    on meds   Arthritis    lower back and sciatica   Depression    on meds   Hyperlipidemia    on meds   Hypertension    migraines/HTN   Migraines    Past Surgical History:  Procedure Laterality Date   BREAST BIOPSY Right 01/13/2021   [place] bx, heart marker, path pending   There are no problems to display for this patient.   REFERRING DIAG: M54.16 (ICD-10-CM) - Lumbar radiculopathy M54.41,G89.29 (ICD-10-CM) - Chronic right-sided low back pain with right-sided sciatica   Rationale for Evaluation and Treatment Rehabilitation  THERAPY DIAG:  Other low back pain  Muscle weakness (generalized)  Hypermobility syndrome  ONSET DATE: Jan/Feb 2023, chronic x a few years  SUBJECTIVE:                                                                                                                                                                                           SUBJECTIVE STATEMENT: Sore after DN last visit; pain is different after DN (sore).  Able to do exercises better.   PERTINENT HISTORY:  Anxiety, arthritis, depression, HTN, migraines  PAIN:  NPRS scale: 3/10 Pain location: low back - Lt Pain description: aching, spasm Aggravating factors: bending forward (repetitive movement) Relieving  factors: TENS unit, lumbar pillow in car, injection    PRECAUTIONS: None  WEIGHT BEARING RESTRICTIONS No  FALLS:  Has patient fallen in last 6 months? 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 Moderate multilevel degenerative disc disease most prominent at L5-S1 with narrowing of bilateral recesses and encroachment of the S1 roots as well as mild bilateral neural foraminal narrowing at L5.  PATIENT SURVEYS:  11/11/2021: FOTO 42 (predicted 57)  SCREENING FOR RED FLAGS: 11/11/2021 Bowel or bladder incontinence: No Spinal tumors: No Cauda equina syndrome: No Compression fracture: No Abdominal aneurysm: No  COGNITION: 11/11/2021 Overall cognitive status: Within functional limits for tasks assessed     SENSATION: 11/11/2021 [facility]  MUSCLE LENGTH: 11/11/2021 Gross hypermobility noted in bil hips  POSTURE:  11/11/2021 rounded shoulders and forward head  PALPATION: 11/11/2021: pain L4/5 with hypomobility; tenderness bil SIJ  LUMBAR ROM:   Active  AROM  11/11/2021 AROM 11/16/2021 AROM 11/23/2021  Flexion WNL with pain    Extension WNL with incr pain 50% WFL c pain noted.  Repeated x 5 in standing with similar pain but up to 75% 50 % WFL c pain  Right lateral flexion WNL with Lt side pain    Left lateral flexion WNL with pain    Right rotation WNL    Left rotation WNL     (Blank rows = not tested)   LOWER EXTREMITY MMT:     MMT Right 11/11/2021 Left 11/11/2021  Hip flexion 3+/5 4/5  Hip extension 3/5 3/5  Hip abduction 3/5 3/5  Knee flexion 5/5 5/5  Knee extension 5/5 5/5   (Blank rows = not tested)  LUMBAR SPECIAL TESTS:  11/11/2021 Slump test: Negative   GAIT: 11/11/2021 Comments: independent without significant deviations   TODAY'S TREATMENT  11/25/21 TherEx SKTC 3x20 sec bil Bridges x10; 3 sec hold Single limb clamshell in hooklying x10 reps bil; L3 band Prone multifidi press x10 reps Prone multifidi press with hamstring curl x10 reps bil alternating Childs pose stretch 5x10 sec hold Quadruped alternating hip extension x 10 reps bil   Manual: Compression to Lt QL, lumbar paraspinals.  Skilled palpation during dry needling   Dry needling: Previously given handout for education.  Performed today to Lt QL , Lt lower lumbar paraspinals c twitch response noted.  No adverse reaction noted from treatment today with decreased pain with exercise following.  11/23/2021: Therex: Sidelying regional rotation 15 sec x 4, bilateral.  Supine bridge 2-3 sec hold x 10  SKC 15 sec x 3 bilateral  Nustep lvl 6 10 mins UE/LE   Manual: Compression to Lt QL, lumbar paraspinals.  Skilled palpation during dry needling   Dry needling: Previously given handout for education.  Performed today to Lt QL , Lt lower lumbar paraspinals c twitch response noted.  No adverse reaction noted from treatment today.   11/16/2021: Therex: Standing lumbar extension x 5 c cues for avoiding pain worsening (just move to tightness) Prone opposite arm/leg lift 3 sec hold 2 x 5 bilateral (barely lifting off table, no pain noted) Supine lumbar trunk rotation stretch 15 sec x 3 bilateral Supine bridge 2-3 sec hold x 10  Seated multifidi isometric UE lift into table 5 sec hold x 10 Nustep lvl 6 8 mins UE/LE  Verbal review of existing HEP  Manual: G2 L4, L5 cPA, Lt and Rt uPA (normal mobility today, mild  symptoms)  11/11/2021 See HEP - performed trial reps PRN with mod cues for comprehension and understanding   PATIENT  EDUCATION:  11/16/2021 Education details: HEP updates Person educated: Patient Education method: Explanation, Demonstration, and Handouts Education comprehension: verbalized understanding, returned demonstration, and needs further education   HOME EXERCISE PROGRAM: Access Code: [email or web address] URL: [email or web address] Date: 11/16/2021 Prepared by: Scot Jun  Exercises - Supine Bridge with Resistance Band  - 1-2 x daily - 7 x weekly - 2 sets - 10 reps - 5 seconds hold - Hooklying Single Leg Bent Knee Fallouts with Resistance  - 1-2 x daily - 7 x weekly - 2 sets - 10 reps - Supine Posterior Pelvic Tilt  - 2 x daily - 7 x weekly - 1 sets - 10 reps - 5 sec hold - Hooklying Isometric Hip Flexion with Opposite Arm  - 2 x daily - 7 x weekly - 1 sets - 10 reps - 5 sec hold - Supine Lower Trunk Rotation  - 2-3 x daily - 7 x weekly - 1 sets - 3-5 reps - 15 hold - Standing Lumbar Extension with Counter  - 3-5 x daily - 7 x weekly - 1 sets - 5-10 reps - Seated Multifidi Isometric  - 2-3 x daily - 7 x weekly - 1 sets - 10 reps - 5-10 hold  ASSESSMENT:  CLINICAL IMPRESSION: Pt tolerated session well today with reduction in pain reported after session.  Will continue to benefit from PT to maximize function.  OBJECTIVE IMPAIRMENTS decreased mobility, decreased strength, hypomobility, increased fascial restrictions, postural dysfunction, pain, and hypermobility .   ACTIVITY LIMITATIONS carrying, lifting, bending, sitting, standing, squatting, transfers, and locomotion level  PARTICIPATION LIMITATIONS: meal prep, cleaning, laundry, driving, shopping, community activity, and yard work  PERSONAL FACTORS 3+ comorbidities: Anxiety, arthritis, depression, HTN, migraines  are also affecting patient's functional outcome.   REHAB POTENTIAL: Good  CLINICAL DECISION MAKING:  Evolving/moderate complexity  EVALUATION COMPLEXITY: Moderate   GOALS: Goals reviewed with patient? Yes  SHORT TERM GOALS: Target date: 12/02/2021  Independent with initial HEP Goal status: on going - assessed 11/16/2021   LONG TERM GOALS: Target date: 12/23/2021  Independent with final HEP Goal status: on going - assessed 11/23/2021  2.  FOTO score improved to 57 Goal status: on going - assessed 11/23/2021  3.  Bil hip strength improved to 4/5 for improved function and mobility Goal status: on going - assessed 11/23/2021  4.  Report pain < 4/10 with increased activity for improved function Goal status: on going - assessed 11/23/2021    PLAN: PT FREQUENCY: 2x/week  PT DURATION: 6 weeks  PLANNED INTERVENTIONS: Therapeutic exercises, Therapeutic activity, Neuromuscular re-education, Patient/Family education, Self Care, Joint mobilization, Aquatic Therapy, Dry Needling, Electrical stimulation, Spinal manipulation, Spinal mobilization, Cryotherapy, Moist heat, Taping, Traction, Ultrasound, Ionotophoresis '4mg'$ /ml Dexamethasone, Manual therapy, and Re-evaluation.  PLAN FOR NEXT SESSION: continue with hip/core stabilization, DN as indicated   Laureen Abrahams, PT, DPT 11/25/21 2:22 PM

## 2021-11-30 ENCOUNTER — Ambulatory Visit: Payer: Commercial Managed Care - HMO | Admitting: Physical Therapy

## 2021-11-30 ENCOUNTER — Encounter: Payer: Self-pay | Admitting: Physical Therapy

## 2021-11-30 DIAGNOSIS — M6281 Muscle weakness (generalized): Secondary | ICD-10-CM

## 2021-11-30 DIAGNOSIS — M5459 Other low back pain: Secondary | ICD-10-CM

## 2021-11-30 DIAGNOSIS — M357 Hypermobility syndrome: Secondary | ICD-10-CM

## 2021-11-30 NOTE — Therapy (Signed)
OUTPATIENT PHYSICAL THERAPY TREATMENT   Patient Name: Annette Rojas MRN: [id number] DOB:1965/11/07, 56 y.o., female Today's Date: 11/30/2021  PCP: Benita Stabile, MD  REFERRING PROVIDER: Lorine Bears, NP   END OF SESSION:   PT End of Session - 11/30/21 1302     Visit Number 5    Number of Visits 12    Date for PT Re-Evaluation 12/23/21    Authorization Type [employer], 30 visit limit, $25 copay    PT Start Time 1300    PT Stop Time 1338    PT Time Calculation (min) 38 min    Activity Tolerance Patient tolerated treatment well    Behavior During Therapy WFL for tasks assessed/performed                 Past Medical History:  Diagnosis Date   Anxiety    on meds   Arthritis    lower back and sciatica   Depression    on meds   Hyperlipidemia    on meds   Hypertension    migraines/HTN   Migraines    Past Surgical History:  Procedure Laterality Date   BREAST BIOPSY Right 01/13/2021   [place] bx, heart marker, path pending   There are no problems to display for this patient.   REFERRING DIAG: M54.16 (ICD-10-CM) - Lumbar radiculopathy M54.41,G89.29 (ICD-10-CM) - Chronic right-sided low back pain with right-sided sciatica   Rationale for Evaluation and Treatment Rehabilitation  THERAPY DIAG:  Other low back pain  Muscle weakness (generalized)  Hypermobility syndrome  ONSET DATE: Jan/Feb 2023, chronic x a few years  SUBJECTIVE:                                                                                                                                                                                           SUBJECTIVE STATEMENT: Back is doing okay, about a 2/10 today   PERTINENT HISTORY:  Anxiety, arthritis, depression, HTN, migraines  PAIN:  NPRS scale: 2/10 Pain location: low back - Lt Pain description: aching, spasm Aggravating factors: bending forward (repetitive movement) Relieving factors: TENS unit, lumbar pillow in car, injection     PRECAUTIONS: None  WEIGHT BEARING RESTRICTIONS No  FALLS:  Has patient fallen in last 6 months? 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 Moderate multilevel degenerative disc disease most prominent at L5-S1 with narrowing of bilateral recesses and encroachment of the S1 roots as well as mild bilateral neural foraminal narrowing at L5.  PATIENT SURVEYS:  11/11/2021: FOTO 42 (predicted 57)  SCREENING FOR RED FLAGS: 11/11/2021 Bowel or bladder incontinence: No Spinal tumors: No Cauda equina syndrome: No Compression fracture: No Abdominal aneurysm: No  COGNITION: 11/11/2021 Overall cognitive status: Within functional limits for tasks assessed     SENSATION: 11/11/2021 [facility]  MUSCLE LENGTH: 11/11/2021 Gross hypermobility noted in bil hips  POSTURE:  11/11/2021 rounded shoulders and forward head  PALPATION: 11/11/2021: pain L4/5 with hypomobility; tenderness bil SIJ  LUMBAR ROM:   Active  AROM  11/11/2021 AROM 11/16/2021 AROM 11/23/2021  Flexion WNL with pain    Extension WNL with incr pain 50% WFL c pain noted.  Repeated x 5 in standing with similar pain but up to 75% 50 % WFL c pain  Right lateral flexion WNL with Lt side pain    Left lateral flexion WNL with pain    Right rotation WNL    Left rotation WNL     (Blank rows = not tested)   LOWER EXTREMITY MMT:    MMT Right 11/11/2021 Left 11/11/2021  Hip flexion  3+/5 4/5  Hip extension 3/5 3/5  Hip abduction 3/5 3/5  Knee flexion 5/5 5/5  Knee extension 5/5 5/5   (Blank rows = not tested)  LUMBAR SPECIAL TESTS:  11/11/2021 Slump test: Negative   GAIT: 11/11/2021 Comments: independent without significant deviations   TODAY'S TREATMENT  11/30/21 TherEx Seated mid back stretch x 20 sec NuStep L5 x 5 min Rows L3 band 2x10 Overhead row (from highest anchor height) 2x10; L3 band Standing hip extension 2x10 reps bil; with 2 sec hold RDL with 10# KB x10  Manual: Gr 2-3 CPA mobs to L4/5.  Skilled palpation during dry needling   Dry needling: Previously given handout for education.  Performed today to bil L4/5 multifidi, c twitch response noted.  No adverse reaction noted from treatment today with decreased pain with exercise following.  11/25/21 TherEx SKTC 3x20 sec bil Bridges x10; 3 sec hold Single limb clamshell in hooklying x10 reps bil; L3 band Prone multifidi press x10 reps Prone multifidi press with hamstring curl x10 reps bil alternating Childs pose stretch 5x10 sec hold Quadruped alternating hip extension x 10 reps bil   Manual: Compression to Lt QL, lumbar paraspinals.  Skilled palpation during dry needling   Dry needling: Previously given handout for education.  Performed today to Lt QL , Lt lower lumbar paraspinals c twitch response noted.  No adverse reaction noted from treatment today with decreased pain with exercise following.  11/23/2021: Therex: Sidelying regional rotation 15 sec x 4, bilateral.  Supine bridge 2-3 sec hold x 10  SKC 15 sec x 3 bilateral  Nustep lvl 6 10 mins UE/LE   Manual: Compression to Lt QL, lumbar paraspinals.  Skilled palpation during dry needling   Dry needling: Previously given handout for education.  Performed today to Lt QL , Lt lower lumbar paraspinals c twitch response noted.  No adverse reaction noted from treatment today.    PATIENT EDUCATION:  11/16/2021 Education details:  HEP updates Person educated: Patient Education method: Explanation, Demonstration, and Handouts Education comprehension: verbalized understanding, returned demonstration, and needs further education  HOME EXERCISE PROGRAM: Access Code: [email or web address] URL: [email or web address] Date: 11/16/2021 Prepared by: Scot Jun  Exercises - Supine Bridge with Resistance Band  - 1-2 x daily - 7 x weekly - 2 sets - 10 reps - 5 seconds hold - Hooklying Single Leg Bent Knee Fallouts with Resistance  - 1-2 x daily - 7 x weekly - 2 sets - 10 reps - Supine Posterior Pelvic Tilt  - 2 x daily - 7 x weekly - 1 sets - 10 reps - 5 sec hold - Hooklying Isometric Hip Flexion with Opposite Arm  - 2 x daily - 7 x weekly - 1 sets - 10 reps - 5 sec hold - Supine Lower Trunk Rotation  - 2-3 x daily - 7 x weekly - 1 sets - 3-5 reps - 15 hold - Standing Lumbar Extension with Counter  - 3-5 x daily - 7 x weekly - 1 sets - 5-10 reps - Seated Multifidi Isometric  - 2-3 x daily - 7 x weekly - 1 sets - 10 reps - 5-10 hold  ASSESSMENT:  CLINICAL IMPRESSION: Pt tolerated session well today with reduction in pain noted.  Will continue to benefit from PT to maximize function.  OBJECTIVE IMPAIRMENTS decreased mobility, decreased strength, hypomobility, increased fascial restrictions, postural dysfunction, pain, and hypermobility .   ACTIVITY LIMITATIONS carrying, lifting, bending, sitting, standing, squatting, transfers, and locomotion level  PARTICIPATION LIMITATIONS: meal prep, cleaning, laundry, driving, shopping, community activity, and yard work  PERSONAL FACTORS 3+ comorbidities: Anxiety, arthritis, depression, HTN, migraines  are also affecting patient's functional outcome.   REHAB POTENTIAL: Good  CLINICAL DECISION MAKING: Evolving/moderate complexity  EVALUATION COMPLEXITY: Moderate   GOALS: Goals reviewed with patient? Yes  SHORT TERM GOALS: Target date: 12/02/2021  Independent with initial  HEP Goal status: on going - assessed 11/16/2021   LONG TERM GOALS: Target date: 12/23/2021  Independent with final HEP Goal status: on going - assessed 11/23/2021  2.  FOTO score improved to 57 Goal status: on going - assessed 11/23/2021  3.  Bil hip strength improved to 4/5 for improved function and mobility Goal status: on going - assessed 11/23/2021  4.  Report pain < 4/10 with increased activity for improved function Goal status: on going - assessed 11/23/2021    PLAN: PT FREQUENCY: 2x/week  PT DURATION: 6 weeks  PLANNED INTERVENTIONS: Therapeutic exercises, Therapeutic activity, Neuromuscular re-education, Patient/Family education, Self Care, Joint mobilization, Aquatic Therapy, Dry Needling, Electrical stimulation, Spinal manipulation, Spinal mobilization, Cryotherapy, Moist heat, Taping, Traction, Ultrasound, Ionotophoresis '4mg'$ /ml Dexamethasone, Manual therapy, and Re-evaluation.  PLAN FOR NEXT SESSION:  continue with hip/core stabilization, DN as indicated   Laureen Abrahams, PT, DPT 11/30/21 1:40 PM

## 2021-12-02 ENCOUNTER — Encounter: Payer: Self-pay | Admitting: Physical Therapy

## 2021-12-02 ENCOUNTER — Ambulatory Visit: Payer: Commercial Managed Care - HMO | Admitting: Physical Therapy

## 2021-12-02 DIAGNOSIS — M6281 Muscle weakness (generalized): Secondary | ICD-10-CM | POA: Diagnosis not present

## 2021-12-02 DIAGNOSIS — M5459 Other low back pain: Secondary | ICD-10-CM

## 2021-12-02 DIAGNOSIS — M357 Hypermobility syndrome: Secondary | ICD-10-CM | POA: Diagnosis not present

## 2021-12-02 NOTE — Therapy (Signed)
OUTPATIENT PHYSICAL THERAPY TREATMENT   Patient Name: Annette Rojas MRN: [id number] DOB:1965/11/01, 56 y.o., female Today's Date: 12/02/2021  PCP: Benita Stabile, MD  REFERRING PROVIDER: Lorine Bears, NP   END OF SESSION:   PT End of Session - 12/02/21 1348     Visit Number 6    Number of Visits 12    Date for PT Re-Evaluation 12/23/21    Authorization Type [employer], 30 visit limit, $25 copay    PT Start Time 1345    PT Stop Time 1415    PT Time Calculation (min) 30 min    Activity Tolerance Patient tolerated treatment well    Behavior During Therapy WFL for tasks assessed/performed                  Past Medical History:  Diagnosis Date   Anxiety    on meds   Arthritis    lower back and sciatica   Depression    on meds   Hyperlipidemia    on meds   Hypertension    migraines/HTN   Migraines    Past Surgical History:  Procedure Laterality Date   BREAST BIOPSY Right 01/13/2021   [place] bx, heart marker, path pending   There are no problems to display for this patient.   REFERRING DIAG: M54.16 (ICD-10-CM) - Lumbar radiculopathy M54.41,G89.29 (ICD-10-CM) - Chronic right-sided low back pain with right-sided sciatica   Rationale for Evaluation and Treatment Rehabilitation  THERAPY DIAG:  Other low back pain  Muscle weakness (generalized)  Hypermobility syndrome  ONSET DATE: Jan/Feb 2023, chronic x a few years  SUBJECTIVE:                                                                                                                                                                                           SUBJECTIVE STATEMENT: "I'm hurting today." Lt low back, tried TENS last night.     PERTINENT HISTORY:  Anxiety, arthritis, depression, HTN, migraines  PAIN:  NPRS scale: 4/10 Pain location: low back - Lt Pain description: aching, spasm Aggravating factors: bending forward (repetitive movement) Relieving factors: TENS unit, lumbar pillow  in car, injection    PRECAUTIONS: None  WEIGHT BEARING RESTRICTIONS No  FALLS:  Has patient fallen in last 6 months? 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 Moderate multilevel degenerative disc disease most prominent at L5-S1 with narrowing of bilateral recesses and encroachment of the S1 roots as well as mild bilateral neural foraminal narrowing at L5.  PATIENT SURVEYS:  11/11/2021: FOTO 42 (predicted 57)  SCREENING FOR RED FLAGS: 11/11/2021 Bowel or bladder incontinence: No Spinal tumors: No Cauda equina syndrome: No Compression fracture: No Abdominal aneurysm: No  COGNITION: 11/11/2021 Overall cognitive status: Within functional limits for tasks assessed     SENSATION: 11/11/2021 [facility]  MUSCLE LENGTH: 11/11/2021 Gross hypermobility noted in bil hips  POSTURE:  11/11/2021 rounded shoulders and forward head  PALPATION: 11/11/2021: pain L4/5 with hypomobility; tenderness bil SIJ  LUMBAR ROM:   Active  AROM  11/11/2021 AROM 11/16/2021 AROM 11/23/2021  Flexion WNL with pain    Extension WNL with incr pain 50% WFL c pain noted.  Repeated x 5 in standing with similar pain but up to 75% 50 % [facility] c pain  Right lateral flexion WNL with Lt side pain    Left lateral flexion WNL with pain    Right rotation WNL    Left rotation WNL     (Blank rows = not tested)   LOWER EXTREMITY MMT:    MMT Right 11/11/2021  Left 11/11/2021  Hip flexion 3+/5 4/5  Hip extension 3/5 3/5  Hip abduction 3/5 3/5  Knee flexion 5/5 5/5  Knee extension 5/5 5/5   (Blank rows = not tested)  LUMBAR SPECIAL TESTS:  11/11/2021 Slump test: Negative   GAIT: 11/11/2021 Comments: independent without significant deviations   TODAY'S TREATMENT  12/02/21 Manual: Gr 2-3 CPA mobs to L4/5.  Skilled palpation during dry needling  Use of tennis ball for self mobilization to lumbar paraspinals and   Dry needling: Previously given handout for education.  Performed today to bil L4/5 multifidi, c twitch response noted.  No adverse reaction noted from treatment today with decreased pain with exercise following.  TherEx SKTC 2x20 sec bil DKTC 2x20 sec Pelvic tilt 10 x 5 sec hold   11/30/21 TherEx Seated mid back stretch x 20 sec NuStep L5 x 5 min Rows L3 band 2x10 Overhead row (from highest anchor height) 2x10; L3 band Standing hip extension 2x10 reps bil; with 2 sec hold RDL with 10# KB x10  Manual: Gr 2-3 CPA mobs to L4/5.  Skilled palpation during dry needling   Dry needling: Previously given handout for education.  Performed today to bil L4/5 multifidi, c twitch response noted.  No adverse reaction noted from treatment today with decreased pain with exercise following.  11/25/21 TherEx SKTC 3x20 sec bil Bridges x10; 3 sec hold Single limb clamshell in hooklying x10 reps bil; L3 band Prone multifidi press x10 reps Prone multifidi press with hamstring curl x10 reps bil alternating Childs pose stretch 5x10 sec hold Quadruped alternating hip extension x 10 reps bil   Manual: Compression to Lt QL, lumbar paraspinals.  Skilled palpation during dry needling   Dry needling: Previously given handout for education.  Performed today to Lt QL , Lt lower lumbar paraspinals c twitch response noted.  No adverse reaction noted from treatment today with decreased pain with exercise  following.  11/23/2021: Therex: Sidelying regional rotation 15 sec x 4, bilateral.  Supine bridge 2-3 sec hold x 10  SKC 15 sec x 3 bilateral  Nustep lvl 6 10 mins UE/LE   Manual: Compression to Lt QL, lumbar paraspinals.  Skilled palpation during dry needling   Dry needling: Previously given handout for education.  Performed today to Lt QL , Lt lower lumbar paraspinals c twitch response noted.  No adverse reaction noted from treatment today.    PATIENT EDUCATION:  11/16/2021 Education details: HEP updates Person educated: Patient Education method: Explanation, Demonstration, and Handouts Education comprehension: verbalized understanding, returned demonstration, and needs further education   HOME EXERCISE PROGRAM: Access Code: [email or web address] URL: [email or web address] Date: 11/16/2021 Prepared by: Scot Jun  Exercises - Supine Bridge with Resistance Band  - 1-2 x daily - 7 x weekly - 2 sets - 10 reps - 5 seconds hold - Hooklying Single Leg Bent Knee Fallouts with Resistance  - 1-2 x daily - 7 x weekly - 2 sets - 10 reps - Supine Posterior Pelvic Tilt  - 2 x daily - 7 x weekly - 1 sets - 10 reps - 5 sec hold - Hooklying Isometric Hip Flexion with Opposite Arm  - 2 x daily - 7 x weekly - 1 sets - 10 reps - 5 sec hold - Supine Lower Trunk Rotation  - 2-3 x daily - 7 x weekly - 1 sets - 3-5 reps - 15 hold - Standing Lumbar Extension with Counter  - 3-5 x daily - 7 x weekly - 1 sets - 5-10 reps - Seated Multifidi Isometric  - 2-3 x daily - 7 x weekly - 1 sets - 10 reps - 5-10 hold  ASSESSMENT:  CLINICAL IMPRESSION: Pain decreased to 2/10 after session and requested to end early due to higher pain today.  Overall still progressing with PT with positive response to DN.  OBJECTIVE IMPAIRMENTS decreased mobility, decreased strength, hypomobility, increased fascial restrictions, postural dysfunction, pain, and hypermobility .   ACTIVITY LIMITATIONS carrying, lifting,  bending, sitting, standing, squatting, transfers, and locomotion level  PARTICIPATION LIMITATIONS: meal prep, cleaning, laundry, driving, shopping, community activity, and yard work  PERSONAL FACTORS 3+ comorbidities: Anxiety, arthritis, depression, HTN, migraines  are also affecting patient's functional outcome.   REHAB POTENTIAL: Good  CLINICAL DECISION MAKING: Evolving/moderate complexity  EVALUATION COMPLEXITY: Moderate   GOALS: Goals reviewed with patient? Yes  SHORT TERM GOALS: Target date: 12/02/2021  Independent with initial HEP Goal status: on going - MET 12/02/21   LONG TERM GOALS: Target date: 12/23/2021  Independent with final HEP Goal status: on going - assessed 11/23/2021  2.  FOTO score improved to 57 Goal status: on going - assessed 11/23/2021  3.  Bil hip strength improved to 4/5 for improved function and mobility Goal status: on going - assessed 11/23/2021  4.  Report pain < 4/10 with increased activity for improved function Goal status: on going - assessed 11/23/2021    PLAN: PT FREQUENCY: 2x/week  PT DURATION: 6 weeks  PLANNED INTERVENTIONS: Therapeutic exercises, Therapeutic activity, Neuromuscular re-education, Patient/Family education, Self Care, Joint mobilization, Aquatic Therapy, Dry Needling, Electrical stimulation, Spinal manipulation, Spinal mobilization, Cryotherapy, Moist heat, Taping, Traction, Ultrasound, Ionotophoresis 4mg /ml Dexamethasone, Manual therapy, and Re-evaluation.  PLAN FOR NEXT SESSION:  check HEP/address questions, update PRN;  continue with hip/core stabilization, DN as indicated   Laureen Abrahams, PT, DPT 12/02/21 2:16 PM

## 2021-12-07 ENCOUNTER — Encounter: Payer: Self-pay | Admitting: Physical Therapy

## 2021-12-07 ENCOUNTER — Ambulatory Visit: Payer: Commercial Managed Care - HMO | Admitting: Physical Therapy

## 2021-12-07 DIAGNOSIS — M5459 Other low back pain: Secondary | ICD-10-CM | POA: Diagnosis not present

## 2021-12-07 DIAGNOSIS — M6281 Muscle weakness (generalized): Secondary | ICD-10-CM | POA: Diagnosis not present

## 2021-12-07 DIAGNOSIS — M357 Hypermobility syndrome: Secondary | ICD-10-CM | POA: Diagnosis not present

## 2021-12-07 NOTE — Therapy (Signed)
OUTPATIENT PHYSICAL THERAPY TREATMENT   Patient Name: Annette Rojas MRN: [id number] DOB:April 08, 1965, 56 y.o., female Today's Date: 12/07/2021  PCP: Benita Stabile, MD  REFERRING PROVIDER: Lorine Bears, NP   END OF SESSION:   PT End of Session - 12/07/21 1339     Visit Number 7    Number of Visits 12    Date for PT Re-Evaluation 12/23/21    Authorization Type [employer], 30 visit limit, $25 copay    PT Start Time 1300    PT Stop Time 1339    PT Time Calculation (min) 39 min    Activity Tolerance Patient tolerated treatment well    Behavior During Therapy WFL for tasks assessed/performed                   Past Medical History:  Diagnosis Date   Anxiety    on meds   Arthritis    lower back and sciatica   Depression    on meds   Hyperlipidemia    on meds   Hypertension    migraines/HTN   Migraines    Past Surgical History:  Procedure Laterality Date   BREAST BIOPSY Right 01/13/2021   [place] bx, heart marker, path pending   There are no problems to display for this patient.   REFERRING DIAG: M54.16 (ICD-10-CM) - Lumbar radiculopathy M54.41,G89.29 (ICD-10-CM) - Chronic right-sided low back pain with right-sided sciatica   Rationale for Evaluation and Treatment Rehabilitation  THERAPY DIAG:  Other low back pain  Muscle weakness (generalized)  Hypermobility syndrome  ONSET DATE: Jan/Feb 2023, chronic x a few years  SUBJECTIVE:                                                                                                                                                                                           SUBJECTIVE STATEMENT: Doing okay, did a lot over the weekend but pain didn't get too high   PERTINENT HISTORY:  Anxiety, arthritis, depression, HTN, migraines  PAIN:  NPRS scale: 4/10; resolves with rest Pain location: low back - Lt Pain description: aching, spasm Aggravating factors: bending forward (repetitive movement) Relieving  factors: TENS unit, lumbar pillow in car, injection    PRECAUTIONS: None  WEIGHT BEARING RESTRICTIONS No  FALLS:  Has patient fallen in last 6 months? 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 Moderate multilevel degenerative disc disease most prominent at L5-S1 with narrowing of bilateral recesses and encroachment of the S1 roots as well as mild bilateral neural foraminal narrowing at L5.  PATIENT SURVEYS:  11/11/2021: FOTO 42 (predicted 57)  SCREENING FOR RED FLAGS: 11/11/2021 Bowel or bladder incontinence: No Spinal tumors: No Cauda equina syndrome: No Compression fracture: No Abdominal aneurysm: No  COGNITION: 11/11/2021 Overall cognitive status: Within functional limits for tasks assessed     SENSATION: 11/11/2021 [facility]  MUSCLE LENGTH: 11/11/2021 Gross hypermobility noted in bil hips  POSTURE:  11/11/2021 rounded shoulders and forward head  PALPATION: 11/11/2021: pain L4/5 with hypomobility; tenderness bil SIJ  LUMBAR ROM:   Active  AROM  11/11/2021 AROM 11/16/2021 AROM 11/23/2021  Flexion WNL with pain    Extension WNL with incr pain 50% WFL c pain noted.  Repeated x 5 in standing with similar pain but up to 75% 50 % [facility] c pain  Right lateral flexion WNL with Lt side pain    Left lateral flexion WNL with pain    Right rotation WNL    Left rotation WNL     (Blank rows = not tested)   LOWER EXTREMITY MMT:     MMT Right 11/11/2021 Left 11/11/2021  Hip flexion 3+/5 4/5  Hip extension 3/5 3/5  Hip abduction 3/5 3/5  Knee flexion 5/5 5/5  Knee extension 5/5 5/5   (Blank rows = not tested)  LUMBAR SPECIAL TESTS:  11/11/2021 Slump test: Negative   GAIT: 11/11/2021 Comments: independent without significant deviations   TODAY'S TREATMENT  12/07/21 Manual: Gr 2-3 CPA mobs to L4/5.  Skilled palpation during dry needling  Dry needling: Previously given handout for education.  Performed today to bil L4/5 multifidi, c twitch response noted.  No adverse reaction noted from treatment today with decreased pain with exercise following.  TherEx Prone pelvic press 10 x 5 sec hold Prone alt hip extension x10 reps bil Prone UE/LE alternating x10 reps bil Bridges 10 x 5 sec hold Single limb isometric hip ext in supine (90/90) 10 x 5 sec hold Rows L3 band x10 reps Overhead rows/lat pull down x 10 reps; L3 band Leg Press 75# 2x10 RDL x10 reps (trial with weight but unable to due to pain)  12/02/21 Manual: Gr 2-3 CPA mobs to L4/5.  Skilled palpation during dry needling  Use of tennis ball for self mobilization to lumbar paraspinals and   Dry needling: Previously given handout for education.  Performed today to bil L4/5 multifidi, c twitch response noted.  No adverse reaction noted from treatment today with decreased pain with exercise following.  TherEx SKTC 2x20 sec bil DKTC 2x20 sec Pelvic tilt 10 x 5 sec hold   11/30/21 TherEx Seated mid back stretch x 20 sec NuStep L5 x 5 min Rows L3 band 2x10 Overhead row (from highest anchor height) 2x10; L3 band Standing hip extension 2x10 reps bil; with 2 sec hold RDL with 10# KB x10  Manual: Gr 2-3 CPA mobs to L4/5.  Skilled palpation during dry needling   Dry needling: Previously given handout for education.  Performed today to bil L4/5 multifidi, c twitch response noted.  No adverse reaction noted from treatment today with decreased pain  with exercise following.   PATIENT EDUCATION:  11/16/2021 Education details: HEP updates Person educated: Patient Education method: Explanation, Demonstration, and Handouts Education comprehension: verbalized understanding, returned demonstration, and needs further education   HOME EXERCISE PROGRAM: Access Code: [email or web address] URL: [email or web address] Date: 11/16/2021 Prepared by: Scot Jun  Exercises - Supine Bridge with Resistance Band  - 1-2 x daily - 7 x weekly - 2 sets - 10 reps - 5 seconds hold - Hooklying Single Leg Bent Knee Fallouts with Resistance  - 1-2 x daily - 7 x weekly - 2 sets - 10 reps - Supine Posterior Pelvic Tilt  - 2 x daily - 7 x weekly - 1 sets - 10 reps - 5 sec hold - Hooklying Isometric Hip Flexion with Opposite Arm  - 2 x daily - 7 x weekly - 1 sets - 10 reps - 5 sec hold - Supine Lower Trunk Rotation  - 2-3 x daily - 7 x weekly - 1 sets - 3-5 reps - 15 hold - Standing Lumbar Extension with Counter  - 3-5 x daily - 7 x weekly - 1 sets - 5-10 reps - Seated Multifidi Isometric  - 2-3 x daily - 7 x weekly - 1 sets - 10 reps - 5-10 hold  ASSESSMENT:  CLINICAL IMPRESSION: Pt tolerated session well today incorporating increased strengthening exercises.  RDL caused increas Pain decreased to 2/10 after session and requested to end early due to higher pain today.  Overall still progressing with PT with positive response to DN.  OBJECTIVE IMPAIRMENTS decreased mobility, decreased strength, hypomobility, increased fascial restrictions, postural dysfunction, pain, and hypermobility .   ACTIVITY LIMITATIONS carrying, lifting, bending, sitting, standing, squatting, transfers, and locomotion level  PARTICIPATION LIMITATIONS: meal prep, cleaning, laundry, driving, shopping, community activity, and yard work  PERSONAL FACTORS 3+ comorbidities: Anxiety, arthritis, depression, HTN, migraines  are also affecting patient's functional outcome.   REHAB  POTENTIAL: Good  CLINICAL DECISION MAKING: Evolving/moderate complexity  EVALUATION COMPLEXITY: Moderate   GOALS: Goals reviewed with patient? Yes  SHORT TERM GOALS: Target date: 12/02/2021  Independent with initial HEP Goal status: on going - MET 12/02/21   LONG TERM GOALS: Target date: 12/23/2021  Independent with final HEP Goal status: on going - assessed 11/23/2021  2.  FOTO score improved to 57 Goal status: on going - assessed 11/23/2021  3.  Bil hip strength improved to 4/5 for improved function and mobility Goal status: on going - assessed 11/23/2021  4.  Report pain < 4/10 with increased activity for improved function Goal status: on going - assessed 11/23/2021    PLAN: PT FREQUENCY: 2x/week  PT DURATION: 6 weeks  PLANNED INTERVENTIONS: Therapeutic exercises, Therapeutic activity, Neuromuscular re-education, Patient/Family education, Self Care, Joint mobilization, Aquatic Therapy, Dry Needling, Electrical stimulation, Spinal manipulation, Spinal mobilization, Cryotherapy, Moist heat, Taping, Traction, Ultrasound, Ionotophoresis 69m/ml Dexamethasone, Manual therapy, and Re-evaluation.  PLAN FOR NEXT SESSION:  continue with strengthening as tolerated,  DN as indicated   SLaureen Abrahams PT, DPT 12/07/21 2:32 PM

## 2021-12-09 ENCOUNTER — Encounter: Payer: Commercial Managed Care - HMO | Admitting: Physical Therapy

## 2021-12-21 ENCOUNTER — Ambulatory Visit: Payer: Commercial Managed Care - HMO | Admitting: Physical Therapy

## 2021-12-21 ENCOUNTER — Other Ambulatory Visit: Payer: Self-pay | Admitting: Surgery

## 2021-12-21 ENCOUNTER — Encounter: Payer: Self-pay | Admitting: Physical Therapy

## 2021-12-21 DIAGNOSIS — M6281 Muscle weakness (generalized): Secondary | ICD-10-CM

## 2021-12-21 DIAGNOSIS — M5459 Other low back pain: Secondary | ICD-10-CM

## 2021-12-21 DIAGNOSIS — D241 Benign neoplasm of right breast: Secondary | ICD-10-CM

## 2021-12-21 DIAGNOSIS — M357 Hypermobility syndrome: Secondary | ICD-10-CM | POA: Diagnosis not present

## 2021-12-21 DIAGNOSIS — D242 Benign neoplasm of left breast: Secondary | ICD-10-CM

## 2021-12-21 NOTE — Therapy (Signed)
OUTPATIENT PHYSICAL THERAPY TREATMENT DISCHARGE SUMMARY   Patient Name: Annette Rojas MRN: [id number] DOB:1965-03-26, 56 y.o., female Today's Date: 12/21/2021  PCP: Benita Stabile, MD  REFERRING PROVIDER: Lorine Bears, NP   END OF SESSION:   PT End of Session - 12/21/21 1018     Visit Number 8    Number of Visits 12    Date for PT Re-Evaluation 12/23/21    Authorization Type [employer], 30 visit limit, $25 copay    PT Start Time 1016    PT Stop Time 1048    PT Time Calculation (min) 32 min    Activity Tolerance Patient tolerated treatment well    Behavior During Therapy WFL for tasks assessed/performed                    Past Medical History:  Diagnosis Date   Anxiety    on meds   Arthritis    lower back and sciatica   Depression    on meds   Hyperlipidemia    on meds   Hypertension    migraines/HTN   Migraines    Past Surgical History:  Procedure Laterality Date   BREAST BIOPSY Right 01/13/2021   [place] bx, heart marker, path pending   There are no problems to display for this patient.   REFERRING DIAG: M54.16 (ICD-10-CM) - Lumbar radiculopathy M54.41,G89.29 (ICD-10-CM) - Chronic right-sided low back pain with right-sided sciatica   Rationale for Evaluation and Treatment Rehabilitation  THERAPY DIAG:  Other low back pain  Muscle weakness (generalized)  Hypermobility syndrome  ONSET DATE: Jan/Feb 2023, chronic x a few years  SUBJECTIVE:                                                                                                                                                                                           SUBJECTIVE STATEMENT: [place] 3.5 hours.  Back was fine but both hips were very uncomfortable.  Pain up to 0-1/10 in back; up to 7/10 in hip.   Pain typically around 1-2/10  PERTINENT HISTORY:  Anxiety, arthritis, depression, HTN, migraines  PAIN:  NPRS scale: 4/10; resolves with rest Pain  location: low back - Lt Pain description: aching, spasm Aggravating factors: bending forward (repetitive movement) Relieving factors: TENS unit, lumbar pillow in car, injection    PRECAUTIONS: None  WEIGHT BEARING RESTRICTIONS No  FALLS:  Has patient fallen in last 6 months? 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 Moderate multilevel degenerative disc disease most prominent at L5-S1 with narrowing of bilateral recesses and encroachment of the S1 roots as well as mild bilateral neural foraminal narrowing at L5.  PATIENT SURVEYS:  11/11/2021: FOTO 42 (predicted 57) 12/21/21: FOTO 63  SCREENING FOR RED FLAGS: 11/11/2021 Bowel or bladder incontinence: No Spinal tumors: No Cauda equina syndrome: No Compression fracture: No Abdominal aneurysm: No  COGNITION: 11/11/2021 Overall cognitive status: Within functional limits for tasks assessed     SENSATION: 11/11/2021 [facility]  MUSCLE LENGTH: 11/11/2021 Gross hypermobility noted in bil hips  POSTURE:  11/11/2021 rounded shoulders and forward head  PALPATION: 11/11/2021: pain L4/5 with hypomobility; tenderness bil SIJ  LUMBAR ROM:   Active  AROM  11/11/2021 AROM 11/16/2021 AROM 11/23/2021  Flexion WNL with pain    Extension WNL with incr pain 50% WFL c pain noted.  Repeated x 5 in standing with similar pain but up to 75% 50 % WFL c pain  Right lateral flexion WNL with Lt side  pain    Left lateral flexion WNL with pain    Right rotation WNL    Left rotation WNL     (Blank rows = not tested)   LOWER EXTREMITY MMT:    MMT Right 11/11/2021 Left 11/11/2021 Right 12/21/21 Left 12/21/21  Hip flexion 3+/5 4/5 5/5 5/5  Hip extension 3/5 3/5 4/5 4/5  Hip abduction 3/5 3/5 4/5 5/5  Knee flexion 5/5 5/5 5/5 5/5  Knee extension 5/5 5/5 5/5 5/5   (Blank rows = not tested)  LUMBAR SPECIAL TESTS:  11/11/2021 Slump test: Negative   GAIT: 11/11/2021 Comments: independent without significant deviations   TODAY'S TREATMENT  12/21/21 Therex Discussed continued exercise and progression of exercises; pt also asking about water aerobics and recommend this as well MMT as noted above  Self-Care Reviewed hypermobility criteria as well as provided handout on hypermobility.  Discussed general exercise considerations as well and other systemic implications of hypermobility.   12/07/21 Manual: Gr 2-3 CPA mobs to L4/5.  Skilled palpation during dry needling  Dry needling: Previously given handout for education.  Performed today to bil L4/5 multifidi, c twitch response noted.  No adverse reaction noted from treatment today with decreased pain with exercise following.  TherEx Prone pelvic press 10 x 5 sec hold Prone alt hip extension x10 reps bil Prone UE/LE alternating x10 reps bil Bridges 10 x 5 sec hold Single limb isometric hip ext in supine (90/90) 10 x 5 sec hold Rows L3 band x10 reps Overhead rows/lat pull down x 10 reps; L3 band Leg Press 75# 2x10 RDL x10 reps (trial with weight but unable to due to pain)  12/02/21 Manual: Gr 2-3 CPA mobs to L4/5.  Skilled palpation during dry needling  Use of tennis ball for self mobilization to lumbar paraspinals and   Dry needling: Previously given handout for education.  Performed today to bil L4/5 multifidi, c twitch response noted.  No adverse reaction noted from treatment today with decreased pain with exercise  following.  TherEx SKTC 2x20 sec bil DKTC 2x20 sec Pelvic tilt 10 x 5 sec hold   11/30/21 TherEx  Seated mid back stretch x 20 sec NuStep L5 x 5 min Rows L3 band 2x10 Overhead row (from highest anchor height) 2x10; L3 band Standing hip extension 2x10 reps bil; with 2 sec hold RDL with 10# KB x10  Manual: Gr 2-3 CPA mobs to L4/5.  Skilled palpation during dry needling   Dry needling: Previously given handout for education.  Performed today to bil L4/5 multifidi, c twitch response noted.  No adverse reaction noted from treatment today with decreased pain with exercise following.   PATIENT EDUCATION:  11/16/2021 Education details: HEP updates Person educated: Patient Education method: Explanation, Demonstration, and Handouts Education comprehension: verbalized understanding, returned demonstration, and needs further education   HOME EXERCISE PROGRAM: Access Code: [email or web address] URL: [email or web address] Date: 11/16/2021 Prepared by: Scot Jun  Exercises - Supine Bridge with Resistance Band  - 1-2 x daily - 7 x weekly - 2 sets - 10 reps - 5 seconds hold - Hooklying Single Leg Bent Knee Fallouts with Resistance  - 1-2 x daily - 7 x weekly - 2 sets - 10 reps - Supine Posterior Pelvic Tilt  - 2 x daily - 7 x weekly - 1 sets - 10 reps - 5 sec hold - Hooklying Isometric Hip Flexion with Opposite Arm  - 2 x daily - 7 x weekly - 1 sets - 10 reps - 5 sec hold - Supine Lower Trunk Rotation  - 2-3 x daily - 7 x weekly - 1 sets - 3-5 reps - 15 hold - Standing Lumbar Extension with Counter  - 3-5 x daily - 7 x weekly - 1 sets - 5-10 reps - Seated Multifidi Isometric  - 2-3 x daily - 7 x weekly - 1 sets - 10 reps - 5-10 hold  ASSESSMENT:  CLINICAL IMPRESSION: Pt has met all goals at this time and is ready for d/c.  She has a good understanding of continued exercise to maximize function and progress.  OBJECTIVE IMPAIRMENTS decreased mobility, decreased strength,  hypomobility, increased fascial restrictions, postural dysfunction, pain, and hypermobility .   ACTIVITY LIMITATIONS carrying, lifting, bending, sitting, standing, squatting, transfers, and locomotion level  PARTICIPATION LIMITATIONS: meal prep, cleaning, laundry, driving, shopping, community activity, and yard work  PERSONAL FACTORS 3+ comorbidities: Anxiety, arthritis, depression, HTN, migraines  are also affecting patient's functional outcome.   REHAB POTENTIAL: Good  CLINICAL DECISION MAKING: Evolving/moderate complexity  EVALUATION COMPLEXITY: Moderate   GOALS: Goals reviewed with patient? Yes  SHORT TERM GOALS: Target date: 12/02/2021  Independent with initial HEP Goal status: on going - MET 12/02/21   LONG TERM GOALS: Target date: 12/23/2021  Independent with final HEP Goal status: MET 12/21/21  2.  FOTO score improved to 57 Goal status: MET 12/21/21  3.  Bil hip strength improved to 4/5 for improved function and mobility Goal status: MET 12/21/21  4.  Report pain < 4/10 with increased activity for improved function Goal status: MET 12/21/21    PLAN: PT FREQUENCY: 2x/week  PT DURATION: 6 weeks  PLANNED INTERVENTIONS: Therapeutic exercises, Therapeutic activity, Neuromuscular re-education, Patient/Family education, Self Care, Joint mobilization, Aquatic Therapy, Dry Needling, Electrical stimulation, Spinal manipulation, Spinal mobilization, Cryotherapy, Moist heat, Taping, Traction, Ultrasound, Ionotophoresis 4mg /ml Dexamethasone, Manual therapy, and Re-evaluation.  PLAN FOR NEXT SESSION:  d/c PT today   Laureen Abrahams, PT, DPT 12/21/21 10:55 AM     PHYSICAL THERAPY DISCHARGE SUMMARY  Visits from Start of Care: 8  Current functional level related to goals / functional outcomes: See above  Remaining deficits: See above   Education / Equipment: HEP, DN   Patient agrees to discharge. Patient goals were met. Patient is being discharged due to  meeting the stated rehab goals.   Laureen Abrahams, PT, DPT 12/21/21 10:56 AM  [facility] [address] [place], [place], [address] Phone: [phone]   Fax:  [phone]

## 2022-01-31 ENCOUNTER — Ambulatory Visit
Admission: RE | Admit: 2022-01-31 | Discharge: 2022-01-31 | Disposition: A | Discharge status: Home / Self Care | Payer: Commercial Managed Care - HMO | Source: Ambulatory Visit | Attending: Surgery | Admitting: Surgery

## 2022-01-31 ENCOUNTER — Ambulatory Visit: Payer: Commercial Managed Care - HMO

## 2022-01-31 DIAGNOSIS — D241 Benign neoplasm of right breast: Secondary | ICD-10-CM

## 2022-01-31 DIAGNOSIS — D242 Benign neoplasm of left breast: Secondary | ICD-10-CM

## 2022-02-18 ENCOUNTER — Ambulatory Visit
Admission: RE | Admit: 2022-02-18 | Discharge: 2022-02-18 | Disposition: A | Discharge status: Home / Self Care | Payer: Commercial Managed Care - HMO | Source: Ambulatory Visit | Attending: Urgent Care | Admitting: Urgent Care

## 2022-02-18 VITALS — BP 123/83 | HR 71 | Temp 97.8°F | Resp 18

## 2022-02-18 DIAGNOSIS — M5442 Lumbago with sciatica, left side: Secondary | ICD-10-CM | POA: Diagnosis present

## 2022-02-18 DIAGNOSIS — N3001 Acute cystitis with hematuria: Secondary | ICD-10-CM

## 2022-02-18 DIAGNOSIS — N3000 Acute cystitis without hematuria: Secondary | ICD-10-CM | POA: Diagnosis present

## 2022-02-18 LAB — POCT URINALYSIS DIP (MANUAL ENTRY)
Bilirubin, UA: NEGATIVE
Blood, UA: NEGATIVE
Glucose, UA: NEGATIVE mg/dL
Ketones, POC UA: NEGATIVE mg/dL
Nitrite, UA: NEGATIVE
Protein Ur, POC: NEGATIVE mg/dL
Spec Grav, UA: 1.02 (ref 1.010–1.025)
Urobilinogen, UA: 0.2 E.U./dL
pH, UA: 7 (ref 5.0–8.0)

## 2022-02-18 MED ORDER — SULFAMETHOXAZOLE-TRIMETHOPRIM 800-160 MG PO TABS: 1.0000 | ORAL_TABLET | Freq: Two times a day (BID) | ORAL | 0 refills | Status: AC

## 2022-02-18 MED ORDER — KETOROLAC TROMETHAMINE 60 MG/2ML IM SOLN
60.0000 mg | Freq: Once | INTRAMUSCULAR | Status: AC
  Administered 2022-02-18: 60 mg via INTRAMUSCULAR

## 2022-02-18 MED ORDER — PREDNISONE 20 MG PO TABS: 60.0000 mg | ORAL_TABLET | Freq: Every day | ORAL | 0 refills | Status: AC

## 2022-02-18 MED ORDER — KETOROLAC TROMETHAMINE 30 MG/ML IJ SOLN: 60.0000 mg | Freq: Once | INTRAMUSCULAR | Status: DC

## 2022-02-18 NOTE — ED Provider Notes (Signed)
[name]    CSN: [id number] Arrival date & time: 02/18/22  1810      History   Chief Complaint Chief Complaint  Patient presents with   Back Pain   Flank Pain   Dysuria    HPI Annette Rojas is a 56 y.o. female.    Back Pain Associated symptoms: dysuria   Flank Pain  Dysuria Associated symptoms: flank pain     Presents to urgent care with complaint of lower back pain starting 3 weeks ago that shoots down her left leg.  Patient has history of chronic back pain and treated to the last year.  She has been treated with steroid injections via epidural as well as dry needling at PT.  She also endorses left-sided flank pain and dysuria x 1 week.  Past Medical History:  Diagnosis Date   Anxiety    on meds   Arthritis    lower back and sciatica   Depression    on meds   Hyperlipidemia    on meds   Hypertension    migraines/HTN   Migraines     There are no problems to display for this patient.   Past Surgical History:  Procedure Laterality Date   BREAST BIOPSY Right 01/13/2021   [place] bx, heart marker, path pending    OB History   No obstetric history on file.      Home Medications    Prior to Admission medications   Medication Sig Start Date End Date Taking? Authorizing Provider  ALPRAZolam Duanne Moron) 0.25 MG tablet Take 0.25 mg by mouth as needed for anxiety.    [provider]  atenolol (TENORMIN) 25 MG tablet Take 25 mg by mouth at bedtime.    [provider]  diphenhydrAMINE HCl (BENADRYL PO) Take 1 tablet by mouth daily as needed.    [provider]  FLUoxetine (PROZAC) 20 MG capsule Take 20 mg by mouth daily. 06/04/21   [provider]  ibuprofen (ADVIL) 600 MG tablet Take 600 mg by mouth every 6 (six) hours as needed.    [provider]  MELATONIN PO Take 1 tablet by mouth at bedtime.    [provider]  methocarbamol (ROBAXIN) 750 MG tablet Take 750 mg by mouth every 8 (eight)  hours. Patient not taking: Reported on 11/11/2021 06/10/21   [provider]  rosuvastatin (CRESTOR) 10 MG tablet Take 1 tablet by mouth daily at 6 (six) AM. 05/18/21   [provider]    Family History Family History  Problem Relation Age of Onset   Colon polyps Mother 22   Colon cancer Mother 43   Colon cancer Maternal Aunt    Colon cancer Maternal Uncle    Breast cancer Paternal Aunt 18   Breast cancer Paternal Aunt 40   Breast cancer Cousin 76       maternal side   Esophageal cancer Neg Hx    Stomach cancer Neg Hx    Rectal cancer Neg Hx     Social History Social History   Tobacco Use   Smoking status: Never   Smokeless tobacco: Never  Vaping Use   Vaping Use: Never used  Substance Use Topics   Alcohol use: Not Currently    Alcohol/week: 0.0 - 1.0 standard drinks of alcohol    Comment: occ.   Drug use: Never     Allergies   Patient has no known allergies.   Review of Systems Review of Systems  Genitourinary:  Positive for dysuria and flank pain.  Musculoskeletal:  Positive for back pain.     Physical Exam Triage Vital Signs ED Triage Vitals [02/18/22 1836]  Enc Vitals Group     BP 123/83     Pulse Rate 71     Resp 18     Temp 97.8 F (36.6 C)     Temp Source Oral     SpO2 96 %     Weight      Height      Head Circumference      Peak Flow      Pain Score      Pain Loc      Pain Edu?      Excl. in [place]?    No data found.  Updated Vital Signs BP 123/83 (BP Location: Left Arm)   Pulse 71   Temp 97.8 F (36.6 C) (Oral)   Resp 18   LMP 08/23/2015   SpO2 96%   Visual Acuity Right Eye Distance:   Left Eye Distance:   Bilateral Distance:    Right Eye Near:   Left Eye Near:    Bilateral Near:     Physical Exam Vitals reviewed.  Constitutional:      Appearance: Normal appearance.  Abdominal:     Tenderness: There is no abdominal tenderness. There is left CVA tenderness.  Musculoskeletal:     Lumbar back: Decreased  range of motion. Positive left straight leg raise test.  Skin:    General: Skin is warm and dry.  Neurological:     General: No focal deficit present.     Mental Status: She is alert and oriented to person, place, and time.  Psychiatric:        Mood and Affect: Mood normal.        Behavior: Behavior normal.      UC Treatments / Results  Labs (all labs ordered are listed, but only abnormal results are displayed) Labs Reviewed - No data to display  EKG   Radiology No results found.  Procedures Procedures (including critical care time)  Medications Ordered in UC Medications - No data to display  Initial Impression / Assessment and Plan / UC Course  I have reviewed the triage vital signs and the nursing notes.  Pertinent labs & imaging results that were available during my care of the patient were reviewed by me and considered in my medical decision making (see chart for details).   Acute on chronic lower back pain.  Will treat with Toradol in clinic and sent home with a 5-day course of prednisone.  Acute cystitis with hematuria.  Patient has trace white blood cells.  Will treat with Bactrim given her flank pain and concern for possible pyelo.  Will send confirmatory urine culture given lack of clear UA evidence.   Final Clinical Impressions(s) / UC Diagnoses   Final diagnoses:  None   Discharge Instructions   None    ED Prescriptions   None    PDMP not reviewed this encounter.   Rose Phi, [place] 02/18/22 [phone]

## 2022-02-18 NOTE — ED Triage Notes (Signed)
Pt. Presents to UC w/ c/o lower back pain that started 3 weeks ago, that shoots down her left leg. Pt. Has been seen and treated for her chronic back pain throughout the past year. Pt. Also endorses left sided flank pain and dysuria for the past week.

## 2022-02-18 NOTE — Discharge Instructions (Signed)
Follow up here or with your primary care provider if your symptoms are worsening or not improving with treatment.     

## 2022-02-21 LAB — URINE CULTURE: Culture: 10000 — AB

## 2022-03-05 ENCOUNTER — Ambulatory Visit: Admit: 2022-03-05 | Discharge status: Against Medical Advice | Payer: 59

## 2022-03-06 ENCOUNTER — Ambulatory Visit
Admission: EM | Admit: 2022-03-06 | Discharge: 2022-03-06 | Disposition: A | Discharge status: Home / Self Care | Payer: 59 | Attending: Urgent Care | Admitting: Urgent Care

## 2022-03-06 DIAGNOSIS — Z113 Encounter for screening for infections with a predominantly sexual mode of transmission: Secondary | ICD-10-CM | POA: Diagnosis not present

## 2022-03-06 DIAGNOSIS — N949 Unspecified condition associated with female genital organs and menstrual cycle: Secondary | ICD-10-CM

## 2022-03-06 DIAGNOSIS — R300 Dysuria: Secondary | ICD-10-CM

## 2022-03-06 LAB — POCT URINALYSIS DIP (MANUAL ENTRY)
Bilirubin, UA: NEGATIVE
Blood, UA: NEGATIVE
Glucose, UA: NEGATIVE mg/dL
Ketones, POC UA: NEGATIVE mg/dL
Nitrite, UA: NEGATIVE
Protein Ur, POC: NEGATIVE mg/dL
Spec Grav, UA: 1.02 (ref 1.010–1.025)
Urobilinogen, UA: 0.2 E.U./dL
pH, UA: 6 (ref 5.0–8.0)

## 2022-03-06 NOTE — Discharge Instructions (Addendum)
Follow up here or with your primary care provider if your symptoms are worsening or not improving.     

## 2022-03-06 NOTE — ED Provider Notes (Addendum)
Annette Rojas    CSN: [id number] Arrival date & time: 03/06/22  1349      History   Chief Complaint No chief complaint on file.   HPI Annette Rojas is a 57 y.o. female.   HPI  She was treated on 1220 03/2021 for acute cystitis with hematuria based on presence of trace white blood cells in her urine.  Prescribed Bactrim given concern for possible pyelo-.  Culture showed presence of Klebsiella oxytoka sensitive to Bactrim.  She continues to report burning pain with urination.  She continues to endorse back pain which she now associates with leg pain.  She states she has a scheduled appointment with a spinal specialist in February.  Past Medical History:  Diagnosis Date   Anxiety    on meds   Arthritis    lower back and sciatica   Depression    on meds   Hyperlipidemia    on meds   Hypertension    migraines/HTN   Migraines     There are no problems to display for this patient.   Past Surgical History:  Procedure Laterality Date   BREAST BIOPSY Right 01/13/2021   [place] bx, heart marker, path pending    OB History   No obstetric history on file.      Home Medications    Prior to Admission medications   Medication Sig Start Date End Date Taking? Authorizing Provider  ALPRAZolam Duanne Moron) 0.25 MG tablet Take 0.25 mg by mouth as needed for anxiety.    [provider]  atenolol (TENORMIN) 25 MG tablet Take 25 mg by mouth at bedtime.    [provider]  diphenhydrAMINE HCl (BENADRYL PO) Take 1 tablet by mouth daily as needed.    [provider]  FLUoxetine (PROZAC) 20 MG capsule Take 20 mg by mouth daily. 06/04/21   [provider]  ibuprofen (ADVIL) 600 MG tablet Take 600 mg by mouth every 6 (six) hours as needed.    [provider]  MELATONIN PO Take 1 tablet by mouth at bedtime.    [provider]  methocarbamol (ROBAXIN) 750 MG tablet Take 750 mg by mouth every 8 (eight) hours. Patient not taking:  Reported on 11/11/2021 06/10/21   [provider]  rosuvastatin (CRESTOR) 10 MG tablet Take 1 tablet by mouth daily at 6 (six) AM. 05/18/21   [provider]    Family History Family History  Problem Relation Age of Onset   Colon polyps Mother 82   Colon cancer Mother 3   Colon cancer Maternal Aunt    Colon cancer Maternal Uncle    Breast cancer Paternal Aunt 42   Breast cancer Paternal Aunt 36   Breast cancer Cousin 22       maternal side   Esophageal cancer Neg Hx    Stomach cancer Neg Hx    Rectal cancer Neg Hx     Social History Social History   Tobacco Use   Smoking status: Never   Smokeless tobacco: Never  Vaping Use   Vaping Use: Never used  Substance Use Topics   Alcohol use: Not Currently    Alcohol/week: 0.0 - 1.0 standard drinks of alcohol    Comment: occ.   Drug use: Never     Allergies   Patient has no known allergies.   Review of Systems Review of Systems   Physical Exam Triage Vital Signs ED Triage Vitals  Enc Vitals Group     BP  Pulse      Resp      Temp      Temp src      SpO2      Weight      Height      Head Circumference      Peak Flow      Pain Score      Pain Loc      Pain Edu?      Excl. in [place]?    No data found.  Updated Vital Signs LMP 08/23/2015   Visual Acuity Right Eye Distance:   Left Eye Distance:   Bilateral Distance:    Right Eye Near:   Left Eye Near:    Bilateral Near:     Physical Exam Vitals reviewed.  Constitutional:      Appearance: Normal appearance.  Skin:    General: Skin is warm and dry.  Neurological:     General: No focal deficit present.     Mental Status: She is alert and oriented to person, place, and time.  Psychiatric:        Mood and Affect: Mood normal.        Behavior: Behavior normal.      UC Treatments / Results  Labs (all labs ordered are listed, but only abnormal results are displayed) Labs Reviewed  POCT URINALYSIS DIP (MANUAL ENTRY)     EKG   Radiology No results found.  Procedures Procedures (including critical care time)  Medications Ordered in UC Medications - No data to display  Initial Impression / Assessment and Plan / UC Course  I have reviewed the triage vital signs and the nursing notes.  Pertinent labs & imaging results that were available during my care of the patient were reviewed by me and considered in my medical decision making (see chart for details).   UA is still not clearly showing signs of UTI.  She completed her course of Bactrim which should have covered the bacteria Klebsiella that was found in her culture.  Patient will do a vaginal swab today to look for an alternate explanation for the itching and burning she experiences with urination.  Will still send urine for culture   Final Clinical Impressions(s) / UC Diagnoses   Final diagnoses:  None   Discharge Instructions   None    ED Prescriptions   None    PDMP not reviewed this encounter.   Rose Phi, [place] 03/06/22 1456    Rose Phi, [place] 03/06/22 1457

## 2022-03-06 NOTE — ED Triage Notes (Signed)
Pt. Presents to UC after being seen on 12/22 for a UTI. Pt. Expresses concern that her UTI has not resolved.

## 2022-03-07 ENCOUNTER — Telehealth: Payer: Self-pay | Admitting: Physical Medicine and Rehabilitation

## 2022-03-07 LAB — CERVICOVAGINAL ANCILLARY ONLY
Bacterial Vaginitis (gardnerella): NEGATIVE
Candida Glabrata: NEGATIVE
Candida Vaginitis: NEGATIVE
Chlamydia: NEGATIVE
Comment: NEGATIVE
Comment: NEGATIVE
Comment: NEGATIVE
Comment: NEGATIVE
Comment: NEGATIVE
Comment: NORMAL
Neisseria Gonorrhea: NEGATIVE
Trichomonas: NEGATIVE

## 2022-03-07 LAB — URINE CULTURE: Culture: <10000 — AB

## 2022-03-07 NOTE — Telephone Encounter (Signed)
Received call from patient. She would like to get copy of her medical records for a 2nd opinion. I emailed her authorization form per her request to email on file. She will complete and return. I will process upon receipt of form.

## 2022-03-09 ENCOUNTER — Telehealth (HOSPITAL_COMMUNITY): Payer: Self-pay | Admitting: Emergency Medicine

## 2022-03-09 NOTE — Telephone Encounter (Signed)
Patient left a voicemail requesting an antibiotic from her recent visit.  Left return voicemail explaining result was negative and no abx indicated.  F/u recommended

## 2022-03-15 ENCOUNTER — Encounter: Payer: Self-pay | Admitting: Physician Assistant

## 2022-03-15 ENCOUNTER — Ambulatory Visit: Payer: 59 | Admitting: Physician Assistant

## 2022-03-15 ENCOUNTER — Ambulatory Visit (INDEPENDENT_AMBULATORY_CARE_PROVIDER_SITE_OTHER): Payer: 59

## 2022-03-15 DIAGNOSIS — M5416 Radiculopathy, lumbar region: Secondary | ICD-10-CM

## 2022-03-15 NOTE — Progress Notes (Signed)
Office Visit Note   Patient: Annette Rojas           Date of Birth: 06-Jun-1965           MRN: [id number] Visit Date: 03/15/2022              Requested by: Albina Billet, MD [address]   [place],  [place] [address] PCP: Albina Billet, MD   Assessment & Plan: Visit Diagnoses:  1. Lumbar radiculopathy     Plan: [name] is a very pleasant 57 year old woman with a 6+ year history of low back pain.  She did have an MRI within the last year which mostly consistent with degenerative changes at Lett and most severe at L5-S1.  She has tried injections with Dr. Ernestina Patches.  She has gotten limited to no relief.  She is also tried physical therapy including dry needling.  She has tried anti-inflammatories as well as steroids.  She uses a TENS unit.  Unfortunately nothing has given her long-term relief.  This is a quality-of-life issue for her and her biggest symptoms now are radicular going down her left leg to her foot.  Denies any loss of bowel or bladder control or weakness.  I think it would be appropriate at this point since she has failed conservative treatment to at least have a visit with Dr. Laurance Flatten our spine specialist to see what he thinks might be the next best step.  She is in agreement with this plan.  She does not want to take any pain medication.  Again she will take anti-inflammatories but has not had good relief  Follow-Up Instructions: Return With Dr. Laurance Flatten.   Orders:  Orders Placed This Encounter  Procedures   XR Lumbar Spine 2-3 Views   No orders of the defined types were placed in this encounter.     Procedures: No procedures performed   Clinical Data: No additional findings.   Subjective: No chief complaint on file.   HPI [name] is a pleasant 57 year old woman with a long history of low back pain.  She presents today with pain and radicular symptoms down her left leg.  She says when she extends her back or turns a certain way she has paresthesias running  down to her foot.  Denies any weakness or loss of bowel or bowel or bladder control.  She has tried physical therapy without success.  She has tried joint dry needling, medications, and has had injections with Dr. Ernestina Patches which helped but were very short-lived  Review of Systems  All other systems reviewed and are negative.    Objective: Vital Signs: LMP 08/23/2015   Physical Exam Constitutional:      Appearance: Normal appearance.  Pulmonary:     Effort: Pulmonary effort is normal.  Skin:    General: Skin is warm and dry.  Neurological:     General: No focal deficit present.     Mental Status: She is alert.     Ortho Exam Examination of her low back.  She is very stiff when arising from sitting position and very tentative when she moves in certain directions.  She has no pain with flexion but extending her back does reproduce this catching and pain in her lower back.  She has 5 out of 5 strength with resisted dorsiflexion plantarflexion of her ankles and extension and flexion of her legs.  On the left side with straight leg raising she does reproduce some of the paresthesias that  run down the back of her leg to her foot Specialty Comments:  MRI LUMBAR SPINE WITHOUT CONTRAST   TECHNIQUE: Multiplanar, multisequence MR imaging of the lumbar spine was performed. No intravenous contrast was administered.   COMPARISON:  Lumbar spine radiograph dated April 20, 2021   FINDINGS: Segmentation:  Standard.   Alignment:  Mild retrolisthesis of L2, L3, L4 and L5.   Vertebrae:  No fracture, evidence of discitis, or bone lesion.   Conus medullaris and cauda equina: Conus extends to the L1 level. Conus and cauda equina appear normal.   Paraspinal and other soft tissues: Negative.   Disc levels:   T12-L1: Asymmetric disc protrusion to the right without significant spinal canal or neural foraminal narrowing mild facet joint arthropathy.   L1-L2: No significant disc bulge. No  neural foraminal stenosis. No central canal stenosis. Mild facet joint arthropathy.   L2-L3: Circumferential disc protrusion and ligamentum flavum hypertrophy with mild narrowing of subarticular recess. Mild facet joint arthropathy. No significant spinal canal or neural foraminal narrowing.   L3-L4: Mild circumferential disc protrusion with narrowing of lateral recesses. Ligamentum flavum hypertrophy. Moderate bilateral facet joint arthropathy. No significant neural foraminal narrowing.   L4-L5: Disc protrusion with bilateral subarticular recess narrowing. Mild ligamentum flavum hypertrophy. Moderate facet joint arthropathy. No significant neural foraminal narrowing.   L5-S1: Moderate to severe disc protrusion and ligamentum flavum hypertrophy with narrowing of spinal canal. Bilateral lateral recess narrowing with encroachment of the S1 nerve roots bilaterally. Mild bilateral neural foraminal narrowing at L5. Moderate to severe facet joint arthropathy.   IMPRESSION: 1.  No evidence of acute fracture or subluxation.   2. Moderate multilevel degenerative disc disease most prominent at L5-S1 with narrowing of bilateral recesses and encroachment of the S1 roots as well as mild bilateral neural foraminal narrowing at L5.   3. Advanced multilevel facet joint arthropathy prominent at L4-L5 and L5-S1.   5.  Distal spinal cord and cauda equina are within normal limits.     Electronically Signed   By: Keane Police D.O.   On: 05/02/2021 17:35  Imaging: XR Lumbar Spine 2-3 Views  Result Date: 03/15/2022 2 view radiographs of her lumbar spine were obtained today.  No acute fractures or changes.  She does have some listhesis at the L3-4 level.  Also almost complete loss of joint space at L5-S1.  Also endplate degenerative changes.    PMFS History: There are no problems to display for this patient.  Past Medical History:  Diagnosis Date   Anxiety    on meds   Arthritis    lower  back and sciatica   Depression    on meds   Hyperlipidemia    on meds   Hypertension    migraines/HTN   Migraines     Family History  Problem Relation Age of Onset   Colon polyps Mother 16   Colon cancer Mother 29   Colon cancer Maternal Aunt    Colon cancer Maternal Uncle    Breast cancer Paternal Aunt 20   Breast cancer Paternal Aunt 65   Breast cancer Cousin 28       maternal side   Esophageal cancer Neg Hx    Stomach cancer Neg Hx    Rectal cancer Neg Hx     Past Surgical History:  Procedure Laterality Date   BREAST BIOPSY Right 01/13/2021   [place] bx, heart marker, path pending   Social History   Occupational History   Not on file  Tobacco Use   Smoking status: Never   Smokeless tobacco: Never  Vaping Use   Vaping Use: Never used  Substance and Sexual Activity   Alcohol use: Not Currently    Alcohol/week: 0.0 - 1.0 standard drinks of alcohol    Comment: occ.   Drug use: Never   Sexual activity: Not on file

## 2022-04-21 ENCOUNTER — Ambulatory Visit (INDEPENDENT_AMBULATORY_CARE_PROVIDER_SITE_OTHER): Payer: 59

## 2022-04-21 ENCOUNTER — Encounter: Payer: Self-pay | Admitting: Orthopedic Surgery

## 2022-04-21 ENCOUNTER — Ambulatory Visit (INDEPENDENT_AMBULATORY_CARE_PROVIDER_SITE_OTHER): Payer: 59 | Admitting: Orthopedic Surgery

## 2022-04-21 VITALS — BP 121/79 | HR 77 | Ht 68.0 in | Wt 186.2 lb

## 2022-04-21 DIAGNOSIS — M5416 Radiculopathy, lumbar region: Secondary | ICD-10-CM

## 2022-04-21 DIAGNOSIS — M5441 Lumbago with sciatica, right side: Secondary | ICD-10-CM

## 2022-04-21 DIAGNOSIS — G8929 Other chronic pain: Secondary | ICD-10-CM

## 2022-04-21 NOTE — Progress Notes (Signed)
Orthopedic Spine Surgery Office Note  Assessment: Patient is a 57 y.o. female with low back pain that radiates into her buttock, posterior thigh, posterior leg, and plantar aspect of the foot.  Pain is in an S1 distribution.  MRI shows lateral recess and disc herniation at L5/S1.    Plan: -Explained that initially conservative treatment is tried as a significant number of patients may experience relief with these treatment modalities. Discussed that the conservative treatments include:  -activity modification  -physical therapy  -over the counter pain medications  -medrol dosepak  -lumbar steroid injections -Patient has tried physical therapy, activity modification, dry needling, Tylenol, intramuscular steroids, oral steroids -Recommended diagnostic and therapeutic L5/S1 injection -Patient should return to office in 4 weeks, x-rays at next visit: None   Patient expressed understanding of the plan and all questions were answered to the patient's satisfaction.   ___________________________________________________________________________   History:  Patient is a 57 y.o. female who presents today for lumbar spine.  Patient has had over 1 year of low back pain radiating into one of her extremities.  Initially, it started as low back pain radiating to the posterior aspect of her right lower extremity.  That got better with either time or the injection she got.  She is not sure exactly which 1 did it.  However, within the last several months, she has developed pain now on the left side.  Starts in the low back goes into the buttock, then into the posterior thigh and leg.  It goes into the plantar aspect of the left foot.  Pain is worse with activity but is still present at rest.  It is present if she is sitting or standing.  She feels its better if she leans forward and is worse if she extends her low back.  Gets paresthesias down the posterior aspect of the left leg in the same distribution as the  pain.  No other numbness or paresthesias.   Weakness: Denies Symptoms of imbalance: Denies Paresthesias and numbness: Yes, down posterior aspect of left lower extremity.  No other numbness or paresthesias Bowel or bladder incontinence: Denies Saddle anesthesia: Denies  Treatments tried: physical therapy, activity modification, dry needling, Tylenol, intramuscular steroids, oral steroids  Review of systems: Denies fevers and chills, night sweats, unexplained weight loss, history of cancer.  Has had pain that wakes her at night  Past medical history: Anxiety Osteoarthritis HLD Migraines HTN Depression  Allergies: NKDA  Past surgical history:  None  Social history: Denies use of nicotine product (smoking, vaping, patches, smokeless) Alcohol use: Yes, 2 drinks per week Denies recreational drug use   Physical Exam:  General: no acute distress, appears stated age Neurologic: alert, answering questions appropriately, following commands Respiratory: unlabored breathing on room air, symmetric chest rise Psychiatric: appropriate affect, normal cadence to speech   MSK (spine):  -Strength exam      Left  Right EHL    5/5  5/5 TA    5/5  5/5 GSC    5/5  5/5 Knee extension  5/5  5/5 Hip flexion   5/5  5/5  -Sensory exam    Sensation intact to light touch in L3-S1 nerve distributions of bilateral lower extremities  -Achilles DTR: 2/4 on the left, 2/4 on the right -Patellar tendon DTR: 2/4 on the left, 2/4 on the right  -Straight leg raise: Negative -Contralateral straight leg raise: Negative -Femoral nerve stretch test: Negative bilaterally -Clonus: no beats bilaterally  -Negative active piriformis test  -Left hip  exam: No pain through range of motion, negative Stinchfield, negative FABER, negative SI joint compression test -Right hip exam: No pain through range of motion  Imaging: XR of the lumbar spine from 03/15/2022 and 04/21/2022 was independently reviewed  and interpreted, showing disc height loss at L5/S1. No fracture or dislocation. No evidence of instability on flexion/extension.   MRI of the lumbar spine from 05/02/2021 was independently reviewed and interpreted, showing mild lateral recess stenosis at L4/5. Lateral recess stenosis at L5/S1. Disc herniation at L5/S1.    Patient name: Annette Rojas Patient MRN: [id number] Date of visit: 04/21/22

## 2022-04-25 ENCOUNTER — Encounter: Payer: Self-pay | Admitting: Physical Medicine and Rehabilitation

## 2022-04-28 ENCOUNTER — Ambulatory Visit: Payer: 59 | Admitting: Physical Medicine and Rehabilitation

## 2022-04-28 ENCOUNTER — Ambulatory Visit: Payer: Self-pay

## 2022-04-28 VITALS — BP 113/73 | HR 72

## 2022-04-28 DIAGNOSIS — M5416 Radiculopathy, lumbar region: Secondary | ICD-10-CM

## 2022-04-28 MED ORDER — METHYLPREDNISOLONE ACETATE 80 MG/ML IJ SUSP
80.0000 mg | Freq: Once | INTRAMUSCULAR | Status: AC
  Administered 2022-04-28: 80 mg

## 2022-04-28 NOTE — Progress Notes (Signed)
Functional Pain Scale - descriptive words and definitions  Unmanageable (7)  Pain interferes with normal ADL's/nothing seems to help/sleep is very difficult/active distractions are very difficult to concentrate on. Severe range order  Average Pain 8   +Driver, -BT, -Dye Allergies.  Lower back pain on left side that radiates into the left buttocks and down the leg to the side of the foot. Sitting makes pain worse

## 2022-04-28 NOTE — Patient Instructions (Signed)

## 2022-04-29 NOTE — Progress Notes (Signed)
Annette Rojas - 57 y.o. female MRN [id number]  Date of birth: June 19, 1965  Office Visit Note: Visit Date: 04/28/2022 PCP: Albina Billet, MD Referred by: Callie Fielding, MD  Subjective: Chief Complaint  Patient presents with   Lower Back - Pain   HPI:  Annette Rojas is a 57 y.o. female who comes in today at the request of Dr. Ileene Rubens for planned Left S1-2 Lumbar Transforaminal epidural steroid injection with fluoroscopic guidance.  The patient has failed conservative care including home exercise, medications, time and activity modification.  This injection will be diagnostic and hopefully therapeutic.  Please see requesting physician notes for further details and justification.  Patient with pretty classic S1 symptoms to the lateral bottom of the foot on the left.  This does fit with prior MRI.   ROS Otherwise per HPI.  Assessment & Plan: Visit Diagnoses:    ICD-10-CM   1. Lumbar radiculopathy  M54.16 XR C-ARM NO REPORT    Epidural Steroid injection    methylPREDNISolone acetate (DEPO-MEDROL) injection 80 mg      Plan: No additional findings.   Meds & Orders:  Meds ordered this encounter  Medications   methylPREDNISolone acetate (DEPO-MEDROL) injection 80 mg    Orders Placed This Encounter  Procedures   XR C-ARM NO REPORT   Epidural Steroid injection    Follow-up: Return for visit to requesting provider as needed.   Procedures: No procedures performed  S1 Lumbosacral Transforaminal Epidural Steroid Injection - Sub-Pedicular Approach with Fluoroscopic Guidance   Patient: Annette Rojas      Date of Birth: August 30, 1965 MRN: [id number] PCP: Albina Billet, MD      Visit Date: 04/28/2022   Universal Protocol:    Date/Time: 03/01/246:05 AM  Consent Given By: the patient  Position:  PRONE  Additional Comments: Vital signs were monitored before and after the procedure. Patient was prepped and draped in the usual sterile fashion. The correct  patient, procedure, and site was verified.   Injection Procedure Details:  Procedure Site One Meds Administered:  Meds ordered this encounter  Medications   methylPREDNISolone acetate (DEPO-MEDROL) injection 80 mg    Laterality: Left  Location/Site:  S1 Foramen   Needle size: 22 ga.  Needle type: Spinal  Needle Placement: Transforaminal  Findings:   -Comments: Excellent flow of contrast along the nerve, nerve root and into the epidural space.  Epidurogram: Contrast epidurogram showed no nerve root cut off or restricted flow pattern.  Procedure Details: After squaring off the sacral end-plate to get a true AP view, the C-arm was positioned so that the best possible view of the S1 foramen was visualized. The soft tissues overlying this structure were infiltrated with 2-3 ml. of 1% Lidocaine without Epinephrine.    The spinal needle was inserted toward the target using a "trajectory" view along the fluoroscope beam.  Under AP and lateral visualization, the needle was advanced so it did not puncture dura. Biplanar projections were used to confirm position. Aspiration was confirmed to be negative for CSF and/or blood. A 1-2 ml. volume of Isovue-250 was injected and flow of contrast was noted at each level. Radiographs were obtained for documentation purposes.   After attaining the desired flow of contrast documented above, a 0.5 to 1.0 ml test dose of 0.25% Marcaine was injected into each respective transforaminal space.  The patient was observed for 90 seconds post injection.  After no sensory deficits were reported, and normal lower extremity motor function  was noted,   the above injectate was administered so that equal amounts of the injectate were placed at each foramen (level) into the transforaminal epidural space.   Additional Comments:  The patient tolerated the procedure well Dressing: Band-Aid with 2 x 2 sterile gauze    Post-procedure details: Patient was observed  during the procedure. Post-procedure instructions were reviewed.  Patient left the clinic in stable condition.   Clinical History: MRI LUMBAR SPINE WITHOUT CONTRAST   TECHNIQUE: Multiplanar, multisequence MR imaging of the lumbar spine was performed. No intravenous contrast was administered.   COMPARISON:  Lumbar spine radiograph dated April 20, 2021   FINDINGS: Segmentation:  Standard.   Alignment:  Mild retrolisthesis of L2, L3, L4 and L5.   Vertebrae:  No fracture, evidence of discitis, or bone lesion.   Conus medullaris and cauda equina: Conus extends to the L1 level. Conus and cauda equina appear normal.   Paraspinal and other soft tissues: Negative.   Disc levels:   T12-L1: Asymmetric disc protrusion to the right without significant spinal canal or neural foraminal narrowing mild facet joint arthropathy.   L1-L2: No significant disc bulge. No neural foraminal stenosis. No central canal stenosis. Mild facet joint arthropathy.   L2-L3: Circumferential disc protrusion and ligamentum flavum hypertrophy with mild narrowing of subarticular recess. Mild facet joint arthropathy. No significant spinal canal or neural foraminal narrowing.   L3-L4: Mild circumferential disc protrusion with narrowing of lateral recesses. Ligamentum flavum hypertrophy. Moderate bilateral facet joint arthropathy. No significant neural foraminal narrowing.   L4-L5: Disc protrusion with bilateral subarticular recess narrowing. Mild ligamentum flavum hypertrophy. Moderate facet joint arthropathy. No significant neural foraminal narrowing.   L5-S1: Moderate to severe disc protrusion and ligamentum flavum hypertrophy with narrowing of spinal canal. Bilateral lateral recess narrowing with encroachment of the S1 nerve roots bilaterally. Mild bilateral neural foraminal narrowing at L5. Moderate to severe facet joint arthropathy.   IMPRESSION: 1.  No evidence of acute fracture or  subluxation.   2. Moderate multilevel degenerative disc disease most prominent at L5-S1 with narrowing of bilateral recesses and encroachment of the S1 roots as well as mild bilateral neural foraminal narrowing at L5.   3. Advanced multilevel facet joint arthropathy prominent at L4-L5 and L5-S1.   5.  Distal spinal cord and cauda equina are within normal limits.     Electronically Signed   By: Keane Police D.O.   On: 05/02/2021 17:35     Objective:  VS:  HT:    WT:   BMI:     BP:113/73  HR:72bpm  TEMP: ( )  RESP:  Physical Exam Vitals and nursing note reviewed.  Constitutional:      General: She is not in acute distress.    Appearance: Normal appearance. She is not ill-appearing.  HENT:     Head: Normocephalic and atraumatic.     Right Ear: External ear normal.     Left Ear: External ear normal.  Eyes:     Extraocular Movements: Extraocular movements intact.  Cardiovascular:     Rate and Rhythm: Normal rate.     Pulses: Normal pulses.  Pulmonary:     Effort: Pulmonary effort is normal. No respiratory distress.  Abdominal:     General: There is no distension.     Palpations: Abdomen is soft.  Musculoskeletal:        General: Tenderness present.     Cervical back: Neck supple.     Right lower leg: No edema.  Left lower leg: No edema.     Comments: Patient has good distal strength with no pain over the greater trochanters.  No clonus or focal weakness.  Skin:    Findings: No erythema, lesion or rash.  Neurological:     General: No focal deficit present.     Mental Status: She is alert and oriented to person, place, and time.     Sensory: No sensory deficit.     Motor: No weakness or abnormal muscle tone.     Coordination: Coordination normal.  Psychiatric:        Mood and Affect: Mood normal.        Behavior: Behavior normal.      Imaging: XR C-ARM NO REPORT  Result Date: 04/28/2022 Please see Notes tab for imaging impression.

## 2022-04-29 NOTE — Procedures (Signed)
S1 Lumbosacral Transforaminal Epidural Steroid Injection - Sub-Pedicular Approach with Fluoroscopic Guidance   Patient: Annette Rojas      Date of Birth: [birth year] MRN: [id number] PCP: Albina Billet, MD      Visit Date: 04/28/2022   Universal Protocol:    Date/Time: 03/01/246:05 AM  Consent Given By: the patient  Position:  PRONE  Additional Comments: Vital signs were monitored before and after the procedure. Patient was prepped and draped in the usual sterile fashion. The correct patient, procedure, and site was verified.   Injection Procedure Details:  Procedure Site One Meds Administered:  Meds ordered this encounter  Medications   methylPREDNISolone acetate (DEPO-MEDROL) injection 80 mg    Laterality: Left  Location/Site:  S1 Foramen   Needle size: 22 ga.  Needle type: Spinal  Needle Placement: Transforaminal  Findings:   -Comments: Excellent flow of contrast along the nerve, nerve root and into the epidural space.  Epidurogram: Contrast epidurogram showed no nerve root cut off or restricted flow pattern.  Procedure Details: After squaring off the sacral end-plate to get a true AP view, the C-arm was positioned so that the best possible view of the S1 foramen was visualized. The soft tissues overlying this structure were infiltrated with 2-3 ml. of 1% Lidocaine without Epinephrine.    The spinal needle was inserted toward the target using a "trajectory" view along the fluoroscope beam.  Under AP and lateral visualization, the needle was advanced so it did not puncture dura. Biplanar projections were used to confirm position. Aspiration was confirmed to be negative for CSF and/or blood. A 1-2 ml. volume of Isovue-250 was injected and flow of contrast was noted at each level. Radiographs were obtained for documentation purposes.   After attaining the desired flow of contrast documented above, a 0.5 to 1.0 ml test dose of 0.25% Marcaine was injected into each  respective transforaminal space.  The patient was observed for 90 seconds post injection.  After no sensory deficits were reported, and normal lower extremity motor function was noted,   the above injectate was administered so that equal amounts of the injectate were placed at each foramen (level) into the transforaminal epidural space.   Additional Comments:  The patient tolerated the procedure well Dressing: Band-Aid with 2 x 2 sterile gauze    Post-procedure details: Patient was observed during the procedure. Post-procedure instructions were reviewed.  Patient left the clinic in stable condition.

## 2022-05-02 ENCOUNTER — Ambulatory Visit: Payer: Self-pay | Admitting: Surgery

## 2022-05-02 ENCOUNTER — Encounter: Payer: Self-pay | Admitting: Physical Medicine and Rehabilitation

## 2022-05-02 DIAGNOSIS — D242 Benign neoplasm of left breast: Secondary | ICD-10-CM

## 2022-05-02 DIAGNOSIS — D241 Benign neoplasm of right breast: Secondary | ICD-10-CM | POA: Diagnosis not present

## 2022-05-02 DIAGNOSIS — N6452 Nipple discharge: Secondary | ICD-10-CM | POA: Diagnosis not present

## 2022-05-02 NOTE — H&P (Signed)
Subjective   Chief Complaint: follow up visit (RE-CHECK - Lt breast papilloma and Rt breast fibroadenoma)     History of Present Illness: Annette Rojas is a 57 y.o. female who is seen today as an office consultation at the request of Dr. Benita Stabile for evaluation of follow up visit (RE-CHECK - Lt breast papilloma and Rt breast fibroadenoma)  This is a 57 year old postmenopausal female who presents with a 3-year history of intermittent bilateral clear nipple discharge.  She also has had intermittent left breast tenderness.  She underwent a thorough work-up including mammograms, ultrasounds, MRI, and bilateral breast biopsies.  She denies any history of bloody nipple discharge.  No previous breast biopsies.  No previous surgeries.   In the right retroareolar breast at 8:00, there is a 6 x 3 x 3 mm mass that was biopsied as a fibroadenoma.  The left lower inner quadrant shows a 6 mm intraductal papilloma.  She had another biopsy in the left upper inner quadrant that was benign.   She has a family history of breast cancer in 2 different paternal aunts and a maternal first cousin.  Both of her parents died within the last couple of years from neuroendocrine tumors.   She was seen a year ago and we recommended bilateral radioactive seed localized lumpectomies.  These procedures were denied by insurance.  She has switched insurance companies.  Follow-up mammogram in December showed no significant changes.  There are no concerning findings for malignancy.  She presents now to discuss scheduling surgery.   Review of Systems: A complete review of systems was obtained from the patient.  I have reviewed this information and discussed as appropriate with the patient.  See HPI as well for other ROS.  Review of Systems  Psychiatric/Behavioral:  Positive for depression. The patient is nervous/anxious.       Medical History: Past Medical History:  Diagnosis Date   Anxiety     Patient Active  Problem List  Diagnosis   Bilateral nipple discharge   Fibroadenoma of right breast   Intraductal papilloma of breast, left    History reviewed. No pertinent surgical history.   No Known Allergies  Current Outpatient Medications on File Prior to Visit  Medication Sig Dispense Refill   ALPRAZolam (XANAX) 0.25 MG tablet Take 0.25 mg by mouth 2 (two) times daily as needed     atenoloL (TENORMIN) 25 MG tablet Take 25 mg by mouth every evening     FLUoxetine (PROZAC) 20 MG capsule Take 20 mg by mouth once daily     rosuvastatin (CRESTOR) 10 MG tablet Take 10 mg by mouth every evening     No current facility-administered medications on file prior to visit.    Family History  Problem Relation Age of Onset   Diabetes Mother    Colon cancer Mother    Diabetes Brother      Social History   Tobacco Use  Smoking Status Never  Smokeless Tobacco Never     Social History   Socioeconomic History   Marital status: Married  Tobacco Use   Smoking status: Never   Smokeless tobacco: Never  Vaping Use   Vaping Use: Never used  Substance and Sexual Activity   Alcohol use: Never   Drug use: Never    Objective:    Vitals:   05/02/22 1047  PainSc: 0-No pain   Physical Exam   Constitutional:  WDWN in NAD, conversant, no obvious deformities; lying in bed comfortably Eyes:  Pupils equal, round; sclera anicteric; moist conjunctiva; no lid lag HENT:  Oral mucosa moist; good dentition  Neck:  No masses palpated, trachea midline; no thyromegaly Lungs:  CTA bilaterally; normal respiratory effort Breasts:  symmetric, no nipple changes; no palpable masses or lymphadenopathy on either side; bilateral accessory axillary breast tissue CV:  Regular rate and rhythm; no murmurs; extremities well-perfused with no edema Abd:  +bowel sounds, soft, non-tender, no palpable organomegaly; no palpable hernias Musc: Normal gait; no apparent clubbing or cyanosis in extremities Lymphatic:  No palpable  cervical or axillary lymphadenopathy Skin:  Warm, dry; no sign of jaundice Psychiatric - alert and oriented x 4; calm mood and affect   Labs, Imaging and Diagnostic Testing: Narrative & Impression  CLINICAL DATA:  57 year old female with biopsy-proven LEFT breast papilloma and RIGHT breast fibroadenoma) with RIGHT nipple discharge) 1 year ago. Insurance 1 year ago denied seed localization prior to surgical excisions. Patient now has insurance approval to proceed with bilateral seed localizations and breast excisions, and now presents for annual mammograms to evaluate for any significant/suspicious findings prior to surgery.   EXAM: DIGITAL DIAGNOSTIC BILATERAL MAMMOGRAM WITH TOMOSYNTHESIS   TECHNIQUE: Bilateral digital diagnostic mammography and breast tomosynthesis was performed.   COMPARISON:  Previous exam(s).   ACR Breast Density Category c: The breast tissue is heterogeneously dense, which may obscure small masses.   FINDINGS: Full field views of both breasts demonstrate unchanged HEART biopsy clip in the RETROAREOLAR RIGHT breast (fibroadenoma), BARBELL biopsy clip within the INNER LEFT breast (papilloma) and CYLINDER clip within the UPPER INNER LEFT breast (fibrocystic changes).   No suspicious mass, non procedural distortion or worrisome calcifications are noted within either breast.   IMPRESSION: No evidence of breast malignancy.  Biopsy clips as described.   RECOMMENDATION: Surgical follow-up.   Bilateral screening mammogram in 1 year.   I have discussed the findings and recommendations with the patient. If applicable, a reminder letter will be sent to the patient regarding the next appointment.   BI-RADS CATEGORY  2: Benign.     Electronically Signed   By: Margarette Canada M.D.   On: 01/31/2022 14:34    Assessment and Plan:  Diagnoses and all orders for this visit:  Fibroadenoma of right breast  Intraductal papilloma of breast, left  Bilateral  nipple discharge     Bilateral radioactive seed localized lumpectomies.  The surgical procedure has been discussed with the patient.  Potential risks, benefits, alternative treatments, and expected outcomes have been explained.  All of the patient's questions at this time have been answered.  The likelihood of reaching the patient's treatment goal is good.  The patient understand the proposed surgical procedure and wishes to proceed.   No follow-ups on file.  Annette Hoadley Jearld Adjutant, MD  05/02/2022 10:56 AM

## 2022-05-02 NOTE — H&P (Signed)
Subjective   Chief Complaint: follow up visit (RE-CHECK - Lt breast papilloma and Rt breast fibroadenoma)   History of Present Illness: Annette Rojas is a 57 y.o. female who is seen today as an office consultation at the request of Dr. Benita Stabile for evaluation of follow up visit (RE-CHECK - Lt breast papilloma and Rt breast fibroadenoma)  This is a 57 year old postmenopausal female who presents with a 3-year history of intermittent bilateral clear nipple discharge.  She also has had intermittent left breast tenderness.  She underwent a thorough work-up including mammograms, ultrasounds, MRI, and bilateral breast biopsies.  She denies any history of bloody nipple discharge.  No previous breast biopsies.  No previous surgeries.   In the right retroareolar breast at 8:00, there is a 6 x 3 x 3 mm mass that was biopsied as a fibroadenoma.  The left lower inner quadrant shows a 6 mm intraductal papilloma.  She had another biopsy in the left upper inner quadrant that was benign.   She has a family history of breast cancer in 2 different paternal aunts and a maternal first cousin.  Both of her parents died within the last couple of years from neuroendocrine tumors.   She was seen a year ago and we recommended bilateral radioactive seed localized lumpectomies.  These procedures were denied by insurance.  She has switched insurance companies.  Follow-up mammogram in December showed no significant changes.  There are no concerning findings for malignancy.  She presents now to discuss scheduling surgery.  No further nipple discharge.  Intermittent twinges of pain in the right lower inner quadrant.  She is mainly struggling with severe sciatica and is being evaluated by a spine surgeon.   Review of Systems: A complete review of systems was obtained from the patient.  I have reviewed this information and discussed as appropriate with the patient.  See HPI as well for other ROS.  Review of Systems   Constitutional: Negative.   HENT: Negative.    Eyes: Negative.   Respiratory: Negative.    Cardiovascular: Negative.   Gastrointestinal: Negative.   Genitourinary: Negative.   Musculoskeletal: Negative.   Skin: Negative.   Neurological: Negative.   Endo/Heme/Allergies: Negative.   Psychiatric/Behavioral:  Positive for depression. The patient is nervous/anxious.       Medical History: Past Medical History:  Diagnosis Date   Anxiety     Patient Active Problem List  Diagnosis   Bilateral nipple discharge   Fibroadenoma of right breast   Intraductal papilloma of breast, left    History reviewed. No pertinent surgical history.   No Known Allergies  Current Outpatient Medications on File Prior to Visit  Medication Sig Dispense Refill   ALPRAZolam (XANAX) 0.25 MG tablet Take 0.25 mg by mouth 2 (two) times daily as needed     atenoloL (TENORMIN) 25 MG tablet Take 25 mg by mouth every evening     FLUoxetine (PROZAC) 20 MG capsule Take 20 mg by mouth once daily     rosuvastatin (CRESTOR) 10 MG tablet Take 10 mg by mouth every evening     No current facility-administered medications on file prior to visit.    Family History  Problem Relation Age of Onset   Diabetes Mother    Colon cancer Mother    Diabetes Brother      Social History   Tobacco Use  Smoking Status Never  Smokeless Tobacco Never     Social History   Socioeconomic History  Marital status: Married  Tobacco Use   Smoking status: Never   Smokeless tobacco: Never  Vaping Use   Vaping Use: Never used  Substance and Sexual Activity   Alcohol use: Never   Drug use: Never    Objective:    Vitals:   05/02/22 1047  PainSc: 0-No pain   Physical Exam   Constitutional:  WDWN in NAD, conversant, no obvious deformities; lying in bed comfortably Eyes:  Pupils equal, round; sclera anicteric; moist conjunctiva; no lid lag HENT:  Oral mucosa moist; good dentition  Neck:  No masses palpated, trachea  midline; no thyromegaly Lungs:  CTA bilaterally; normal respiratory effort Breasts:  symmetric, no nipple changes; no palpable masses or lymphadenopathy on either side; bilateral accessory axillary breast tissue CV:  Regular rate and rhythm; no murmurs; extremities well-perfused with no edema Abd:  +bowel sounds, soft, non-tender, no palpable organomegaly; no palpable hernias Musc: Normal gait; no apparent clubbing or cyanosis in extremities Lymphatic:  No palpable cervical or axillary lymphadenopathy Skin:  Warm, dry; no sign of jaundice Psychiatric - alert and oriented x 4; calm mood and affect   Labs, Imaging and Diagnostic Testing: Narrative & Impression  CLINICAL DATA:  57 year old female with biopsy-proven LEFT breast papilloma and RIGHT breast fibroadenoma) with RIGHT nipple discharge) 1 year ago. Insurance 1 year ago denied seed localization prior to surgical excisions. Patient now has insurance approval to proceed with bilateral seed localizations and breast excisions, and now presents for annual mammograms to evaluate for any significant/suspicious findings prior to surgery.   EXAM: DIGITAL DIAGNOSTIC BILATERAL MAMMOGRAM WITH TOMOSYNTHESIS   TECHNIQUE: Bilateral digital diagnostic mammography and breast tomosynthesis was performed.   COMPARISON:  Previous exam(s).   ACR Breast Density Category c: The breast tissue is heterogeneously dense, which may obscure small masses.   FINDINGS: Full field views of both breasts demonstrate unchanged HEART biopsy clip in the RETROAREOLAR RIGHT breast (fibroadenoma), BARBELL biopsy clip within the INNER LEFT breast (papilloma) and CYLINDER clip within the UPPER INNER LEFT breast (fibrocystic changes).   No suspicious mass, non procedural distortion or worrisome calcifications are noted within either breast.   IMPRESSION: No evidence of breast malignancy.  Biopsy clips as described.   RECOMMENDATION: Surgical follow-up.    Bilateral screening mammogram in 1 year.   I have discussed the findings and recommendations with the patient. If applicable, a reminder letter will be sent to the patient regarding the next appointment.   BI-RADS CATEGORY  2: Benign.     Electronically Signed   By: Margarette Canada M.D.   On: 01/31/2022 14:34    Assessment and Plan:  Diagnoses and all orders for this visit:  Fibroadenoma of right breast  Intraductal papilloma of breast, left  Bilateral nipple discharge     Bilateral radioactive seed localized lumpectomies.  The surgical procedure has been discussed with the patient.  Potential risks, benefits, alternative treatments, and expected outcomes have been explained.  All of the patient's questions at this time have been answered.  The likelihood of reaching the patient's treatment goal is good.  The patient understand the proposed surgical procedure and wishes to proceed.   No follow-ups on file.  Langdon Crosson Jearld Adjutant, MD  05/02/2022 10:56 AM

## 2022-05-04 ENCOUNTER — Other Ambulatory Visit: Payer: Self-pay | Admitting: Surgery

## 2022-05-04 DIAGNOSIS — D241 Benign neoplasm of right breast: Secondary | ICD-10-CM

## 2022-05-04 DIAGNOSIS — D242 Benign neoplasm of left breast: Secondary | ICD-10-CM

## 2022-05-05 ENCOUNTER — Encounter: Payer: Self-pay | Admitting: Physical Medicine and Rehabilitation

## 2022-05-05 ENCOUNTER — Telehealth: Payer: Self-pay

## 2022-05-05 NOTE — Telephone Encounter (Signed)
Annette Rojas called triage. She said that she just missed your call. [phone]

## 2022-05-05 NOTE — Telephone Encounter (Signed)
Left voicemail to return call to clinic to schedule OV

## 2022-05-09 ENCOUNTER — Encounter: Payer: Self-pay | Admitting: Physical Medicine and Rehabilitation

## 2022-05-09 ENCOUNTER — Ambulatory Visit: Payer: 59 | Admitting: Physical Medicine and Rehabilitation

## 2022-05-09 DIAGNOSIS — M5116 Intervertebral disc disorders with radiculopathy, lumbar region: Secondary | ICD-10-CM

## 2022-05-09 DIAGNOSIS — M47816 Spondylosis without myelopathy or radiculopathy, lumbar region: Secondary | ICD-10-CM | POA: Diagnosis not present

## 2022-05-09 DIAGNOSIS — M5416 Radiculopathy, lumbar region: Secondary | ICD-10-CM

## 2022-05-09 DIAGNOSIS — M48061 Spinal stenosis, lumbar region without neurogenic claudication: Secondary | ICD-10-CM

## 2022-05-09 MED ORDER — BACLOFEN 10 MG PO TABS: 10.0000 mg | ORAL_TABLET | Freq: Three times a day (TID) | ORAL | 0 refills | Status: DC | PRN

## 2022-05-09 NOTE — Telephone Encounter (Signed)
Spoke with patient and she is scheduled for OV on 05/09/22

## 2022-05-09 NOTE — Progress Notes (Unsigned)
Functional Pain Scale - descriptive words and definitions  Unmanageable (7)  Pain interferes with normal ADL's/nothing seems to help/sleep is very difficult/active distractions are very difficult to concentrate on. Severe range order  Average Pain 8-9  Lower back pain. Injection did not work

## 2022-05-10 ENCOUNTER — Encounter: Payer: Self-pay | Admitting: Physical Medicine and Rehabilitation

## 2022-05-10 NOTE — Progress Notes (Signed)
Annette Rojas - 57 y.o. female MRN [id number]  Date of birth: 1966-01-11  Office Visit Note: Visit Date: 05/09/2022 PCP: Albina Billet, MD Referred by: Albina Billet, MD  Subjective: Chief Complaint  Patient presents with   Lower Back - Pain   HPI: Annette Rojas is a 57 y.o. female who comes in today for evaluation and management of continued severe chronic and now recalcitrant low back and intermittent left and intermittent right leg pain down the back of the leg in a pretty clear S1 dermatome.  Her history is that she initially saw Bevely Palmer persons, PA-C in February of last year with chronic history of back and leg pain.  MRI was performed which is reviewed fully below showing mainly degenerative changes with lateral recess narrowing particular at L5-S1 but also with pretty severe facet arthropathy at L4-5 and L5-S1.  At the time we initially saw her we completed S1 transforaminal injection with really good relief of the symptoms on the right I believe.  Her symptoms have flip-flopped right to left at times.  The last injection we performed was a repeat injection at the request of Dr. Ileene Rubens.  Dr. Laurance Flatten saw her for evaluation of surgical options.  That last injection did help but not for very long and she has been in severe debilitating pain since that time.  In the office today she is laying down on the table for the interview.  She continues to take medications including ibuprofen and Tylenol.  She has tried all manner of other muscle relaxers and pain medication without much relief.  She has had Robaxin as well as Flexeril.  She reports that her pain is fairly constant but worse with standing and ambulating better at rest.  She really feels like this last injection did not help much at all.  She reports that her pain is unmanageable and on the functional scale is 7 out of 10 and interfering with normal ADLs and she would prefer this to be severe in range.  She does get symptoms  down the back of the left leg but also right leg.  She has some paresthesias.  No focal weakness.  She is very distressed at this point with the amount of pain that she is having.  Dr. Laurance Flatten wanted to see her back in 4 weeks after he last saw her but there is no appointment in the chart she does not remember a follow-up appointment.  He suggested the option of decompression but wanted to maximize her conservative care first.  She has had physical therapy and continues with home exercise when she can.  All those are documented.  She reports that her daughter has a similar problem she thinks with pain in her back and she reports getting Bilateral injections with Celestone and doing quite well.  I had a long talk with her today about cortisone and medications and how they are pretty much equivalent but that I am happy to try Celestone if we do an injection.  We also talked about the use of Bilateral injections only really if there Bilateral symptoms overall.      Review of Systems  Musculoskeletal:  Positive for back pain and joint pain.  Neurological:  Positive for tingling.  All other systems reviewed and are negative.  Otherwise per HPI.  Assessment & Plan: Visit Diagnoses:    ICD-10-CM   1. Lumbar radiculopathy  M54.16 Ambulatory referral to Physical Medicine Rehab    2.  Radiculopathy due to lumbar intervertebral disc disorder  M51.16 Ambulatory referral to Physical Medicine Rehab    3. Spondylosis without myelopathy or radiculopathy, lumbar region  M47.816 Ambulatory referral to Physical Medicine Rehab    4. Bilateral stenosis of lateral recess of lumbar spine  M48.061 Ambulatory referral to Physical Medicine Rehab       Plan: Findings:  Chronic worsening, severe and recalcitrant low back pain and left more than right radicular pain in an S1 dermatome that does fit MRI findings of lateral recess narrowing particular L5-S1.  Differential diagnosis is some of her back pain likely is facet  joint mediated low back pain with pretty severe facet arthropathy contributing to the lateral recess narrowing.  Talked with her today about options and we are going to repeat the injection potentially bilaterally with Celestone to see if she gets more relief if she is pretty miserable at this point.  I have also prescribed baclofen as a muscle relaxer for her to try.  She also will need to follow-up with Dr. Laurance Flatten eventually to see if there is any other options she can look at depending on how much pain she is having.  Lastly could look at new MRI to see if there is been any changes like disc herniation etc.  No real red flags to consider imaging right at this point.  Will repeat S1 transforaminal injection.  Injection will be done with fluoroscopic guidance.  Prior injections did offer more than 50% relief as noted above and lasted more than 3 months and did give the patient more functional ability for activities of daily living and was also beneficial in that it did reduce their medication requirement.  They have had physical therapy and continue with directed home exercise program.  Current medication management is not beneficial in increasing their functional status.  Please note that procedures are done as part of a comprehensive orthopedic and pain management program with access to in-house orthopedics, spine surgery and physical therapy as well as access to [facility] counseling if needed.      Meds & Orders:  Meds ordered this encounter  Medications   DISCONTD: baclofen (LIORESAL) 10 MG tablet    Sig: Take 1 tablet (10 mg total) by mouth every 8 (eight) hours as needed for muscle spasms (Pain).    Dispense:  60 tablet    Refill:  0    Orders Placed This Encounter  Procedures   Ambulatory referral to Physical Medicine Rehab    Follow-up: Return for Bilateral S1 transforaminal injection.   Procedures: No procedures performed      Clinical History: MRI  LUMBAR SPINE WITHOUT CONTRAST   TECHNIQUE: Multiplanar, multisequence MR imaging of the lumbar spine was performed. No intravenous contrast was administered.   COMPARISON:  Lumbar spine radiograph dated April 20, 2021   FINDINGS: Segmentation:  Standard.   Alignment:  Mild retrolisthesis of L2, L3, L4 and L5.   Vertebrae:  No fracture, evidence of discitis, or bone lesion.   Conus medullaris and cauda equina: Conus extends to the L1 level. Conus and cauda equina appear normal.   Paraspinal and other soft tissues: Negative.   Disc levels:   T12-L1: Asymmetric disc protrusion to the right without significant spinal canal or neural foraminal narrowing mild facet joint arthropathy.   L1-L2: No significant disc bulge. No neural foraminal stenosis. No central canal stenosis. Mild facet joint arthropathy.   L2-L3: Circumferential disc protrusion and ligamentum flavum hypertrophy with mild  narrowing of subarticular recess. Mild facet joint arthropathy. No significant spinal canal or neural foraminal narrowing.   L3-L4: Mild circumferential disc protrusion with narrowing of lateral recesses. Ligamentum flavum hypertrophy. Moderate bilateral facet joint arthropathy. No significant neural foraminal narrowing.   L4-L5: Disc protrusion with bilateral subarticular recess narrowing. Mild ligamentum flavum hypertrophy. Moderate facet joint arthropathy. No significant neural foraminal narrowing.   L5-S1: Moderate to severe disc protrusion and ligamentum flavum hypertrophy with narrowing of spinal canal. Bilateral lateral recess narrowing with encroachment of the S1 nerve roots bilaterally. Mild bilateral neural foraminal narrowing at L5. Moderate to severe facet joint arthropathy.   IMPRESSION: 1.  No evidence of acute fracture or subluxation.   2. Moderate multilevel degenerative disc disease most prominent at L5-S1 with narrowing of bilateral recesses and encroachment of  the S1 roots as well as mild bilateral neural foraminal narrowing at L5.   3. Advanced multilevel facet joint arthropathy prominent at L4-L5 and L5-S1.   5.  Distal spinal cord and cauda equina are within normal limits.     Electronically Signed   By: Keane Police D.O.   On: 05/02/2021 17:35   She reports that she has never smoked. She has never used smokeless tobacco. No results for input(s): "HGBA1C", "LABURIC" in the last 8760 hours.  Objective:  VS:  HT:    WT:   BMI:     BP:   HR: bpm  TEMP: ( )  RESP:  Physical Exam  Ortho Exam  Imaging: No results found.  Past Medical/Family/Surgical/Social History: Medications & Allergies reviewed per EMR, new medications updated. Patient Active Problem List   Diagnosis Date Noted   Bilateral nipple discharge 03/03/2021   Fibroadenoma of right breast 03/03/2021   Intraductal papilloma of breast, left 03/03/2021   Past Medical History:  Diagnosis Date   Anxiety    on meds   Arthritis    lower back and sciatica   Depression    on meds   Hyperlipidemia    on meds   Hypertension    migraines/HTN   Migraines    Family History  Problem Relation Age of Onset   Colon polyps Mother 63   Colon cancer Mother 32   Colon cancer Maternal Aunt    Colon cancer Maternal Uncle    Breast cancer Paternal Aunt 61   Breast cancer Paternal Aunt 28   Breast cancer Cousin 34       maternal side   Esophageal cancer Neg Hx    Stomach cancer Neg Hx    Rectal cancer Neg Hx    Past Surgical History:  Procedure Laterality Date   BREAST BIOPSY Right 01/13/2021   [place] bx, heart marker, path pending   Social History   Occupational History   Not on file  Tobacco Use   Smoking status: Never   Smokeless tobacco: Never  Vaping Use   Vaping Use: Never used  Substance and Sexual Activity   Alcohol use: Not Currently    Alcohol/week: 0.0 - 1.0 standard drinks of alcohol    Comment: occ.   Drug use: Never   Sexual activity: Not on file

## 2022-05-20 ENCOUNTER — Telehealth: Payer: Self-pay | Admitting: Physical Medicine and Rehabilitation

## 2022-05-20 ENCOUNTER — Encounter: Payer: Self-pay | Admitting: Orthopedic Surgery

## 2022-05-20 NOTE — Telephone Encounter (Signed)
Patient wanting to get an injection. Please advise

## 2022-05-24 NOTE — Telephone Encounter (Signed)
Spoke with patient and she is having breast surgery the week of 05/30/22. She would like to hold off until after the recovery of her surgery

## 2022-05-30 HISTORY — PX: BREAST EXCISIONAL BIOPSY: SUR124

## 2022-06-02 ENCOUNTER — Other Ambulatory Visit: Payer: Self-pay

## 2022-06-02 ENCOUNTER — Encounter (HOSPITAL_BASED_OUTPATIENT_CLINIC_OR_DEPARTMENT_OTHER): Payer: Self-pay | Admitting: Surgery

## 2022-06-07 DIAGNOSIS — M5106 Intervertebral disc disorders with myelopathy, lumbar region: Secondary | ICD-10-CM | POA: Diagnosis not present

## 2022-06-07 DIAGNOSIS — M5432 Sciatica, left side: Secondary | ICD-10-CM | POA: Diagnosis not present

## 2022-06-08 ENCOUNTER — Encounter (HOSPITAL_BASED_OUTPATIENT_CLINIC_OR_DEPARTMENT_OTHER)
Admission: RE | Admit: 2022-06-08 | Discharge: 2022-06-08 | Disposition: A | Discharge status: Home / Self Care | Payer: 59 | Source: Ambulatory Visit | Attending: Surgery | Admitting: Surgery

## 2022-06-08 ENCOUNTER — Ambulatory Visit
Admission: RE | Admit: 2022-06-08 | Discharge: 2022-06-08 | Disposition: A | Discharge status: Home / Self Care | Payer: 59 | Source: Ambulatory Visit | Attending: Surgery | Admitting: Surgery

## 2022-06-08 DIAGNOSIS — D241 Benign neoplasm of right breast: Secondary | ICD-10-CM | POA: Diagnosis not present

## 2022-06-08 DIAGNOSIS — D242 Benign neoplasm of left breast: Secondary | ICD-10-CM | POA: Diagnosis not present

## 2022-06-08 DIAGNOSIS — I10 Essential (primary) hypertension: Secondary | ICD-10-CM | POA: Insufficient documentation

## 2022-06-08 DIAGNOSIS — Z01810 Encounter for preprocedural cardiovascular examination: Secondary | ICD-10-CM | POA: Diagnosis not present

## 2022-06-08 DIAGNOSIS — Z803 Family history of malignant neoplasm of breast: Secondary | ICD-10-CM | POA: Diagnosis not present

## 2022-06-08 DIAGNOSIS — N6452 Nipple discharge: Secondary | ICD-10-CM | POA: Diagnosis not present

## 2022-06-08 DIAGNOSIS — R928 Other abnormal and inconclusive findings on diagnostic imaging of breast: Secondary | ICD-10-CM | POA: Diagnosis not present

## 2022-06-08 DIAGNOSIS — Z01812 Encounter for preprocedural laboratory examination: Secondary | ICD-10-CM | POA: Diagnosis present

## 2022-06-08 HISTORY — PX: BREAST BIOPSY: SHX20

## 2022-06-08 NOTE — Progress Notes (Addendum)
Sent reminder text to have pre-procedure testing done today before surgery         Patient Instructions  The night before surgery:  No food after midnight. ONLY clear liquids after midnight  The day of surgery (if you do NOT have diabetes):  Drink ONE (1) Pre-Surgery Clear Ensure as directed.   This drink was given to you during your hospital  pre-op appointment visit. The pre-op nurse will instruct you on the time to drink the  Pre-Surgery Ensure depending on your surgery time. Finish the drink at the designated time by the pre-op nurse.  Nothing else to drink after completing the  Pre-Surgery Clear Ensure.  The day of surgery (if you have diabetes): Drink ONE (1) Gatorade 2 (G2) as directed. This drink was given to you during your hospital  pre-op appointment visit.  The pre-op nurse will instruct you on the time to drink the   Gatorade 2 (G2) depending on your surgery time. Color of the Gatorade may vary. Red is not allowed. Nothing else to drink after completing the  Gatorade 2 (G2).         If you have questions, please contact your surgeon's office.

## 2022-06-09 ENCOUNTER — Other Ambulatory Visit: Payer: Self-pay

## 2022-06-09 ENCOUNTER — Ambulatory Visit (HOSPITAL_BASED_OUTPATIENT_CLINIC_OR_DEPARTMENT_OTHER)
Admission: RE | Admit: 2022-06-09 | Discharge: 2022-06-09 | Disposition: A | Discharge status: Home / Self Care | Payer: 59 | Attending: Surgery | Admitting: Surgery

## 2022-06-09 ENCOUNTER — Ambulatory Visit
Admission: RE | Admit: 2022-06-09 | Discharge: 2022-06-09 | Disposition: A | Discharge status: Home / Self Care | Payer: 59 | Source: Ambulatory Visit | Attending: Surgery | Admitting: Surgery

## 2022-06-09 ENCOUNTER — Ambulatory Visit (HOSPITAL_BASED_OUTPATIENT_CLINIC_OR_DEPARTMENT_OTHER): Payer: 59 | Admitting: Anesthesiology

## 2022-06-09 ENCOUNTER — Encounter (HOSPITAL_BASED_OUTPATIENT_CLINIC_OR_DEPARTMENT_OTHER): Payer: Self-pay | Admitting: Surgery

## 2022-06-09 ENCOUNTER — Encounter (HOSPITAL_BASED_OUTPATIENT_CLINIC_OR_DEPARTMENT_OTHER)
Admission: RE | Disposition: A | Discharge status: Home / Self Care | Payer: Self-pay | Source: Home / Self Care | Attending: Surgery

## 2022-06-09 DIAGNOSIS — N6452 Nipple discharge: Secondary | ICD-10-CM | POA: Insufficient documentation

## 2022-06-09 DIAGNOSIS — D241 Benign neoplasm of right breast: Secondary | ICD-10-CM | POA: Insufficient documentation

## 2022-06-09 DIAGNOSIS — D242 Benign neoplasm of left breast: Secondary | ICD-10-CM | POA: Diagnosis not present

## 2022-06-09 DIAGNOSIS — I10 Essential (primary) hypertension: Secondary | ICD-10-CM | POA: Insufficient documentation

## 2022-06-09 DIAGNOSIS — Z803 Family history of malignant neoplasm of breast: Secondary | ICD-10-CM | POA: Diagnosis not present

## 2022-06-09 DIAGNOSIS — Z01818 Encounter for other preprocedural examination: Secondary | ICD-10-CM

## 2022-06-09 DIAGNOSIS — R928 Other abnormal and inconclusive findings on diagnostic imaging of breast: Secondary | ICD-10-CM | POA: Diagnosis not present

## 2022-06-09 HISTORY — PX: BREAST LUMPECTOMY WITH RADIOACTIVE SEED LOCALIZATION: SHX6424

## 2022-06-09 SURGERY — BREAST LUMPECTOMY WITH RADIOACTIVE SEED LOCALIZATION
Anesthesia: General | Site: Breast | Laterality: Bilateral

## 2022-06-09 MED ORDER — MIDAZOLAM HCL 2 MG/2ML IJ SOLN
INTRAMUSCULAR | Status: AC
  Filled 2022-06-09: qty 2

## 2022-06-09 MED ORDER — BUPIVACAINE HCL (PF) 0.25 % IJ SOLN
INTRAMUSCULAR | Status: DC | PRN
  Administered 2022-06-09: 10 mL

## 2022-06-09 MED ORDER — CHLORHEXIDINE GLUCONATE CLOTH 2 % EX PADS: 6.0000 | MEDICATED_PAD | Freq: Once | CUTANEOUS | Status: DC

## 2022-06-09 MED ORDER — LIDOCAINE HCL (CARDIAC) PF 100 MG/5ML IV SOSY
PREFILLED_SYRINGE | INTRAVENOUS | Status: DC | PRN
  Administered 2022-06-09: 20 mg via INTRAVENOUS

## 2022-06-09 MED ORDER — DEXAMETHASONE SODIUM PHOSPHATE 10 MG/ML IJ SOLN
INTRAMUSCULAR | Status: AC
  Filled 2022-06-09: qty 1

## 2022-06-09 MED ORDER — AMISULPRIDE (ANTIEMETIC) 5 MG/2ML IV SOLN: 10.0000 mg | Freq: Once | INTRAVENOUS | Status: DC | PRN

## 2022-06-09 MED ORDER — MIDAZOLAM HCL 2 MG/2ML IJ SOLN
INTRAMUSCULAR | Status: DC | PRN
  Administered 2022-06-09: 2 mg via INTRAVENOUS

## 2022-06-09 MED ORDER — ACETAMINOPHEN 500 MG PO TABS
1000.0000 mg | ORAL_TABLET | ORAL | Status: AC
  Administered 2022-06-09: 1000 mg via ORAL

## 2022-06-09 MED ORDER — DEXAMETHASONE SODIUM PHOSPHATE 10 MG/ML IJ SOLN
INTRAMUSCULAR | Status: DC | PRN
  Administered 2022-06-09: 5 mg via INTRAVENOUS

## 2022-06-09 MED ORDER — CEFAZOLIN SODIUM-DEXTROSE 2-4 GM/100ML-% IV SOLN
INTRAVENOUS | Status: AC
  Filled 2022-06-09: qty 100

## 2022-06-09 MED ORDER — PROPOFOL 10 MG/ML IV BOLUS
INTRAVENOUS | Status: AC
  Filled 2022-06-09: qty 20

## 2022-06-09 MED ORDER — CEFAZOLIN SODIUM-DEXTROSE 2-3 GM-%(50ML) IV SOLR
INTRAVENOUS | Status: DC | PRN
  Administered 2022-06-09: 2 g via INTRAVENOUS

## 2022-06-09 MED ORDER — FENTANYL CITRATE (PF) 100 MCG/2ML IJ SOLN: 25.0000 ug | INTRAMUSCULAR | Status: DC | PRN

## 2022-06-09 MED ORDER — FENTANYL CITRATE (PF) 100 MCG/2ML IJ SOLN
INTRAMUSCULAR | Status: AC
  Filled 2022-06-09: qty 2

## 2022-06-09 MED ORDER — EPHEDRINE SULFATE (PRESSORS) 50 MG/ML IJ SOLN
INTRAMUSCULAR | Status: DC | PRN
  Administered 2022-06-09: 10 mg via INTRAVENOUS

## 2022-06-09 MED ORDER — FENTANYL CITRATE (PF) 100 MCG/2ML IJ SOLN
INTRAMUSCULAR | Status: DC | PRN
  Administered 2022-06-09 (×2): 25 ug via INTRAVENOUS

## 2022-06-09 MED ORDER — ONDANSETRON HCL 4 MG/2ML IJ SOLN
INTRAMUSCULAR | Status: AC
  Filled 2022-06-09: qty 2

## 2022-06-09 MED ORDER — LACTATED RINGERS IV SOLN: INTRAVENOUS | Status: DC | PRN

## 2022-06-09 MED ORDER — PROPOFOL 10 MG/ML IV BOLUS
INTRAVENOUS | Status: DC | PRN
  Administered 2022-06-09: 200 mg via INTRAVENOUS

## 2022-06-09 MED ORDER — LACTATED RINGERS IV SOLN: INTRAVENOUS | Status: DC

## 2022-06-09 MED ORDER — ONDANSETRON HCL 4 MG/2ML IJ SOLN
INTRAMUSCULAR | Status: DC | PRN
  Administered 2022-06-09: 4 mg via INTRAVENOUS

## 2022-06-09 MED ORDER — OXYCODONE HCL 5 MG/5ML PO SOLN: 5.0000 mg | Freq: Once | ORAL | Status: DC | PRN

## 2022-06-09 MED ORDER — LIDOCAINE 2% (20 MG/ML) 5 ML SYRINGE
INTRAMUSCULAR | Status: AC
  Filled 2022-06-09: qty 5

## 2022-06-09 MED ORDER — ACETAMINOPHEN 500 MG PO TABS
ORAL_TABLET | ORAL | Status: AC
  Filled 2022-06-09: qty 2

## 2022-06-09 MED ORDER — CEFAZOLIN SODIUM-DEXTROSE 2-4 GM/100ML-% IV SOLN: 2.0000 g | INTRAVENOUS | Status: DC

## 2022-06-09 MED ORDER — OXYCODONE HCL 5 MG PO TABS: 5.0000 mg | ORAL_TABLET | Freq: Once | ORAL | Status: DC | PRN

## 2022-06-09 SURGICAL SUPPLY — 47 items
APL PRP STRL LF DISP 70% ISPRP (MISCELLANEOUS) ×1
APL SKNCLS STERI-STRIP NONHPOA (GAUZE/BANDAGES/DRESSINGS) ×1
APPLIER CLIP 9.375 MED OPEN (MISCELLANEOUS)
APR CLP MED 9.3 20 MLT OPN (MISCELLANEOUS)
BENZOIN TINCTURE PRP APPL 2/3 (GAUZE/BANDAGES/DRESSINGS) ×1 IMPLANT
BLADE HEX COATED 2.75 (ELECTRODE) ×1 IMPLANT
BLADE SURG 15 STRL LF DISP TIS (BLADE) ×1 IMPLANT
BLADE SURG 15 STRL SS (BLADE) ×1
CANISTER SUC SOCK COL 7IN (MISCELLANEOUS) IMPLANT
CANISTER SUCT 1200ML W/VALVE (MISCELLANEOUS) IMPLANT
CHLORAPREP W/TINT 26 (MISCELLANEOUS) ×1 IMPLANT
CLIP APPLIE 9.375 MED OPEN (MISCELLANEOUS) ×1 IMPLANT
COVER BACK TABLE 60X90IN (DRAPES) ×1 IMPLANT
COVER MAYO STAND STRL (DRAPES) ×1 IMPLANT
COVER PROBE CYLINDRICAL 5X96 (MISCELLANEOUS) ×1 IMPLANT
DRAPE LAPAROTOMY 100X72 PEDS (DRAPES) ×1 IMPLANT
DRAPE UTILITY XL STRL (DRAPES) ×1 IMPLANT
DRSG TEGADERM 4X4.75 (GAUZE/BANDAGES/DRESSINGS) ×1 IMPLANT
ELECT REM PT RETURN 9FT ADLT (ELECTROSURGICAL) ×1
ELECTRODE REM PT RTRN 9FT ADLT (ELECTROSURGICAL) ×1 IMPLANT
GAUZE SPONGE 2X2 STRL 8-PLY (GAUZE/BANDAGES/DRESSINGS) IMPLANT
GAUZE SPONGE 4X4 12PLY STRL LF (GAUZE/BANDAGES/DRESSINGS) IMPLANT
GLOVE BIO SURGEON STRL SZ7 (GLOVE) ×1 IMPLANT
GLOVE BIOGEL PI IND STRL 7.5 (GLOVE) ×1 IMPLANT
GOWN STRL REUS W/ TWL LRG LVL3 (GOWN DISPOSABLE) ×2 IMPLANT
GOWN STRL REUS W/TWL LRG LVL3 (GOWN DISPOSABLE) ×2
KIT MARKER MARGIN INK (KITS) ×1 IMPLANT
NDL HYPO 25X1 1.5 SAFETY (NEEDLE) ×1 IMPLANT
NEEDLE HYPO 25X1 1.5 SAFETY (NEEDLE) ×1 IMPLANT
NS IRRIG 1000ML POUR BTL (IV SOLUTION) ×1 IMPLANT
PACK BASIN DAY SURGERY FS (CUSTOM PROCEDURE TRAY) ×1 IMPLANT
PENCIL SMOKE EVACUATOR (MISCELLANEOUS) ×1 IMPLANT
SLEEVE SCD COMPRESS KNEE MED (STOCKING) ×1 IMPLANT
SPIKE FLUID TRANSFER (MISCELLANEOUS) IMPLANT
SPONGE T-LAP 18X18 ~~LOC~~+RFID (SPONGE) IMPLANT
SPONGE T-LAP 4X18 ~~LOC~~+RFID (SPONGE) ×1 IMPLANT
STRIP CLOSURE SKIN 1/2X4 (GAUZE/BANDAGES/DRESSINGS) ×1 IMPLANT
SUT MON AB 4-0 PC3 18 (SUTURE) ×1 IMPLANT
SUT SILK 2 0 SH (SUTURE) IMPLANT
SUT VIC AB 3-0 SH 27 (SUTURE) ×2
SUT VIC AB 3-0 SH 27X BRD (SUTURE) ×1 IMPLANT
SYR BULB EAR ULCER 3OZ GRN STR (SYRINGE) IMPLANT
SYR CONTROL 10ML LL (SYRINGE) ×1 IMPLANT
TOWEL GREEN STERILE FF (TOWEL DISPOSABLE) ×1 IMPLANT
TRAY FAXITRON CT DISP (TRAY / TRAY PROCEDURE) ×1 IMPLANT
TUBE CONNECTING 20X1/4 (TUBING) IMPLANT
YANKAUER SUCT BULB TIP NO VENT (SUCTIONS) IMPLANT

## 2022-06-09 NOTE — Discharge Instructions (Addendum)
[place] Office Phone Number [phone]  BREAST BIOPSY/ PARTIAL MASTECTOMY: POST OP INSTRUCTIONS  Always review your discharge instruction sheet given to you by the facility where your surgery was performed.  IF YOU HAVE DISABILITY OR FAMILY LEAVE FORMS, YOU MUST BRING THEM TO THE OFFICE FOR PROCESSING.  DO NOT GIVE THEM TO YOUR DOCTOR.  A prescription for pain medication may be given to you upon discharge.  Take your pain medication as prescribed, if needed.  If narcotic pain medicine is not needed, then you may take acetaminophen (Tylenol) or ibuprofen (Advil) as needed. Take your usually prescribed medications unless otherwise directed If you need a refill on your pain medication, please contact your pharmacy.  They will contact our office to request authorization.  Prescriptions will not be filled after 5pm or on week-ends. You should eat very light the first 24 hours after surgery, such as soup, crackers, pudding, etc.  Resume your normal diet the day after surgery. Most patients will experience some swelling and bruising in the breast.  Ice packs and a good support bra will help.  Swelling and bruising can take several days to resolve.  It is common to experience some constipation if taking pain medication after surgery.  Increasing fluid intake and taking a stool softener will usually help or prevent this problem from occurring.  A mild laxative (Milk of Magnesia or Miralax) should be taken according to package directions if there are no bowel movements after 48 hours. Unless discharge instructions indicate otherwise, you may remove your bandages 24-48 hours after surgery, and you may shower at that time.  You may have steri-strips (small skin tapes) in place directly over the incision.  These strips should be left on the skin for 7-10 days.  If your surgeon used skin glue on the incision, you may shower in 24 hours.  The glue will flake off over the next 2-3 weeks.  Any  sutures or staples will be removed at the office during your follow-up visit. ACTIVITIES:  You may resume regular daily activities (gradually increasing) beginning the next day.  Wearing a good support bra or sports bra minimizes pain and swelling.  You may have sexual intercourse when it is comfortable. You may drive when you no longer are taking prescription pain medication, you can comfortably wear a seatbelt, and you can safely maneuver your car and apply brakes. RETURN TO WORK:  ______________________________________________________________________________________ Dennis Bast should see your doctor in the office for a follow-up appointment approximately two weeks after your surgery.  Your doctor's nurse will typically make your follow-up appointment when she calls you with your pathology report.  Expect your pathology report 2-3 business days after your surgery.  You may call to check if you do not hear from [place] after three days. OTHER INSTRUCTIONS: _______________________________________________________________________________________________ _____________________________________________________________________________________________________________________________________ _____________________________________________________________________________________________________________________________________ _____________________________________________________________________________________________________________________________________  WHEN TO CALL YOUR DOCTOR: Fever over 101.0 Nausea and/or vomiting. Extreme swelling or bruising. Continued bleeding from incision. Increased pain, redness, or drainage from the incision.  The clinic staff is available to answer your questions during regular business hours.  Please don't hesitate to call and ask to speak to one of the nurses for clinical concerns.  If you have a medical emergency, go to the nearest emergency room or call 911.  A surgeon from [facility] is always on call at the hospital.  For further questions, please visit [email or web address]   Post Anesthesia Home Care Instructions  Activity: Get plenty of rest for the remainder of the  day. A responsible individual must stay with you for 24 hours following the procedure.  For the next 24 hours, DO NOT: -Drive a car -Advertising copywriter -Drink alcoholic beverages -Take any medication unless instructed by your physician -Make any legal decisions or sign important papers.  Meals: Start with liquid foods such as gelatin or soup. Progress to regular foods as tolerated. Avoid greasy, spicy, heavy foods. If nausea and/or vomiting occur, drink only clear liquids until the nausea and/or vomiting subsides. Call your physician if vomiting continues.  Special Instructions/Symptoms: Your throat may feel dry or sore from the anesthesia or the breathing tube placed in your throat during surgery. If this causes discomfort, gargle with warm salt water. The discomfort should disappear within 24 hours.  If you had a scopolamine patch placed behind your ear for the management of post- operative nausea and/or vomiting:  1. The medication in the patch is effective for 72 hours, after which it should be removed.  Wrap patch in a tissue and discard in the trash. Wash hands thoroughly with soap and water. 2. You may remove the patch earlier than 72 hours if you experience unpleasant side effects which may include dry mouth, dizziness or visual disturbances. 3. Avoid touching the patch. Wash your hands with soap and water after contact with the patch.    No tylenol until after 2:19pm today if needed.

## 2022-06-09 NOTE — H&P (Signed)
Subjective    Chief Complaint: follow up visit (RE-CHECK - Lt breast papilloma and Rt breast fibroadenoma)       History of Present Illness: Annette Rojas is a 57 y.o. female who is seen today as an office consultation at the request of Dr. Dewaine Oats for evaluation of follow up visit (RE-CHECK - Lt breast papilloma and Rt breast fibroadenoma)   This is a 57 year old postmenopausal female who presents with a 3-year history of intermittent bilateral clear nipple discharge.  She also has had intermittent left breast tenderness.  She underwent a thorough work-up including mammograms, ultrasounds, MRI, and bilateral breast biopsies.  She denies any history of bloody nipple discharge.  No previous breast biopsies.  No previous surgeries.   In the right retroareolar breast at 8:00, there is a 6 x 3 x 3 mm mass that was biopsied as a fibroadenoma.  The left lower inner quadrant shows a 6 mm intraductal papilloma.  She had another biopsy in the left upper inner quadrant that was benign.   She has a family history of breast cancer in 2 different paternal aunts and a maternal first cousin.  Both of her parents died within the last couple of years from neuroendocrine tumors.   She was seen a year ago and we recommended bilateral radioactive seed localized lumpectomies.  These procedures were denied by insurance.  She has switched insurance companies.  Follow-up mammogram in December showed no significant changes.  There are no concerning findings for malignancy.  She presents now to discuss scheduling surgery.     Review of Systems: A complete review of systems was obtained from the patient.  I have reviewed this information and discussed as appropriate with the patient.  See HPI as well for other ROS.   Review of Systems  Psychiatric/Behavioral:  Positive for depression. The patient is nervous/anxious.         Medical History:     Past Medical History:  Diagnosis Date   Anxiety            Patient Active Problem List  Diagnosis   Bilateral nipple discharge   Fibroadenoma of right breast   Intraductal papilloma of breast, left      History reviewed. No pertinent surgical history.    No Known Allergies         Current Outpatient Medications on File Prior to Visit  Medication Sig Dispense Refill   ALPRAZolam (XANAX) 0.25 MG tablet Take 0.25 mg by mouth 2 (two) times daily as needed       atenoloL (TENORMIN) 25 MG tablet Take 25 mg by mouth every evening       FLUoxetine (PROZAC) 20 MG capsule Take 20 mg by mouth once daily       rosuvastatin (CRESTOR) 10 MG tablet Take 10 mg by mouth every evening        No current facility-administered medications on file prior to visit.           Family History  Problem Relation Age of Onset   Diabetes Mother     Colon cancer Mother     Diabetes Brother        Social History       Tobacco Use  Smoking Status Never  Smokeless Tobacco Never      Social History        Socioeconomic History   Marital status: Married  Tobacco Use   Smoking status: Never   Smokeless tobacco: Never  Vaping  Use   Vaping Use: Never used  Substance and Sexual Activity   Alcohol use: Never   Drug use: Never      Objective:         Vitals:    05/02/22 1047  PainSc: 0-No pain    Physical Exam    Constitutional:  WDWN in NAD, conversant, no obvious deformities; lying in bed comfortably Eyes:  Pupils equal, round; sclera anicteric; moist conjunctiva; no lid lag HENT:  Oral mucosa moist; good dentition  Neck:  No masses palpated, trachea midline; no thyromegaly Lungs:  CTA bilaterally; normal respiratory effort Breasts:  symmetric, no nipple changes; no palpable masses or lymphadenopathy on either side; bilateral accessory axillary breast tissue CV:  Regular rate and rhythm; no murmurs; extremities well-perfused with no edema Abd:  +bowel sounds, soft, non-tender, no palpable organomegaly; no palpable hernias Musc: Normal  gait; no apparent clubbing or cyanosis in extremities Lymphatic:  No palpable cervical or axillary lymphadenopathy Skin:  Warm, dry; no sign of jaundice Psychiatric - alert and oriented x 4; calm mood and affect     Labs, Imaging and Diagnostic Testing: Narrative & Impression  CLINICAL DATA:  57 year old female with biopsy-proven LEFT breast papilloma and RIGHT breast fibroadenoma) with RIGHT nipple discharge) 1 year ago. Insurance 1 year ago denied seed localization prior to surgical excisions. Patient now has insurance approval to proceed with bilateral seed localizations and breast excisions, and now presents for annual mammograms to evaluate for any significant/suspicious findings prior to surgery.   EXAM: DIGITAL DIAGNOSTIC BILATERAL MAMMOGRAM WITH TOMOSYNTHESIS   TECHNIQUE: Bilateral digital diagnostic mammography and breast tomosynthesis was performed.   COMPARISON:  Previous exam(s).   ACR Breast Density Category c: The breast tissue is heterogeneously dense, which may obscure small masses.   FINDINGS: Full field views of both breasts demonstrate unchanged HEART biopsy clip in the RETROAREOLAR RIGHT breast (fibroadenoma), BARBELL biopsy clip within the INNER LEFT breast (papilloma) and CYLINDER clip within the UPPER INNER LEFT breast (fibrocystic changes).   No suspicious mass, non procedural distortion or worrisome calcifications are noted within either breast.   IMPRESSION: No evidence of breast malignancy.  Biopsy clips as described.   RECOMMENDATION: Surgical follow-up.   Bilateral screening mammogram in 1 year.   I have discussed the findings and recommendations with the patient. If applicable, a reminder letter will be sent to the patient regarding the next appointment.   BI-RADS CATEGORY  2: Benign.     Electronically Signed   By: Harmon Pier M.D.   On: 01/31/2022 14:34      Assessment and Plan:  Diagnoses and all orders for this visit:    Fibroadenoma of right breast   Intraductal papilloma of breast, left   Bilateral nipple discharge       Bilateral radioactive seed localized lumpectomies.  The surgical procedure has been discussed with the patient.  Potential risks, benefits, alternative treatments, and expected outcomes have been explained.  All of the patient's questions at this time have been answered.  The likelihood of reaching the patient's treatment goal is good.  The patient understand the proposed surgical procedure and wishes to proceed.   Annette Rojas. Annette Skains, MD, [facility]  General Surgery   06/09/2022 8:59 AM

## 2022-06-09 NOTE — Op Note (Signed)
Pre-op Diagnosis:  Right breast fibroadenoma Left breast intraductal papilloma Post-op Diagnosis: same Procedure:  Bilateral radioactive seed localized lumpectomies Surgeon:  Tailyn Hantz K. Anesthesia:  GEN - LMA Indications:  This is a 56 year old postmenopausal female who presents with a 3-year history of intermittent bilateral clear nipple discharge.  She also has had intermittent left breast tenderness.  She underwent a thorough work-up including mammograms, ultrasounds, MRI, and bilateral breast biopsies.  She denies any history of bloody nipple discharge.  No previous breast biopsies.  No previous surgeries.   In the right retroareolar breast at 8:00, there is a 6 x 3 x 3 mm mass that was biopsied as a fibroadenoma.  The left lower inner quadrant shows a 6 mm intraductal papilloma.  She had another biopsy in the left upper inner quadrant that was benign.   She has a family history of breast cancer in 2 different paternal aunts and a maternal first cousin.  Both of her parents died within the last couple of years from neuroendocrine tumors.   She was seen a year ago and we recommended bilateral radioactive seed localized lumpectomies.  These procedures were denied by insurance.  She has switched insurance companies.  Follow-up mammogram in December showed no significant changes.  There are no concerning findings for malignancy.  She presents now for surgery.   Description of procedure: The patient is brought to the operating room placed in supine position on the operating room table. After an adequate level of general anesthesia was obtained, her entire chest was prepped with ChloraPrep and draped in sterile fashion. A timeout was taken to ensure the proper patient and proper procedure.  We began on the right side. We interrogated the breast with the neoprobe. We made a circumareolar incision around the lateral side of the nipple after infiltrating with 0.25% Marcaine. Dissection was carried  down in the breast tissue with cautery. We used the neoprobe to guide [place] towards the radioactive seed. We excised an area of tissue around the radioactive seed 1.5 cm in diameter. The specimen was removed and was oriented with a paint kit. Specimen mammogram showed the radioactive seed as well as the biopsy clip within the specimen. This was sent for pathologic examination. There is no residual radioactivity within the biopsy cavity. We inspected carefully for hemostasis. The wound was closed with a deep layer of 3-0 Vicryl and a subcuticular layer of 4-0 Monocryl.  We then turned our attention to the left side.  We interrogated the breast with the neoprobe. We made a circumareolar incision around the lower medial side of the nipple after infiltrating with 0.25% Marcaine. Dissection was carried down in the breast tissue with cautery. We used the neoprobe to guide [place] towards the radioactive seed. We excised an area of tissue around the radioactive seed 1.5 cm in diameter. The specimen was removed and was oriented with a paint kit. Specimen mammogram showed the radioactive seed as well as the biopsy clip within the specimen. This was sent for pathologic examination. There is no residual radioactivity within the biopsy cavity. We inspected carefully for hemostasis. The wound was thoroughly irrigated. The wound was closed with a deep layer of 3-0 Vicryl and a subcuticular layer of 4-0 Monocryl.   Benzoin Steri-Strips were applied to both incisions.  Sterile dressings were applied. The patient was then extubated and brought to the recovery room in stable condition. All sponge, instrument, and needle counts are correct.  Wilmon Arms. Corliss Skains, MD, [facility]  General/ Trauma Surgery  06/09/2022 11:04 AM

## 2022-06-09 NOTE — Anesthesia Postprocedure Evaluation (Signed)
Anesthesia Post Note  Patient: Annette Rojas  Procedure(s) Performed: BILATERAL BREAST LUMPECTOMY WITH RADIOACTIVE SEED LOCALIZATION (Bilateral: Breast)     Patient location during evaluation: PACU Anesthesia Type: General Level of consciousness: awake and alert Pain management: pain level controlled Vital Signs Assessment: post-procedure vital signs reviewed and stable Respiratory status: spontaneous breathing, nonlabored ventilation, respiratory function stable and patient connected to nasal cannula oxygen Cardiovascular status: blood pressure returned to baseline and stable Postop Assessment: no apparent nausea or vomiting Anesthetic complications: no  No notable events documented.  Last Vitals:  Vitals:   06/09/22 1200 06/09/22 1219  BP: 132/66 125/71  Pulse: 69 66  Resp: 12 16  Temp:  36.6 C  SpO2:  96%    Last Pain:  Vitals:   06/09/22 1219  TempSrc: Temporal  PainSc: 0-No pain                 Kennieth Rad

## 2022-06-09 NOTE — Transfer of Care (Signed)
Immediate Anesthesia Transfer of Care Note  Patient: Annette Rojas  Procedure(s) Performed: BILATERAL BREAST LUMPECTOMY WITH RADIOACTIVE SEED LOCALIZATION (Bilateral: Breast)  Patient Location: PACU  Anesthesia Type:General  Level of Consciousness: awake, drowsy, and patient cooperative  Airway & Oxygen Therapy: Patient Spontanous Breathing and Patient connected to face mask oxygen  Post-op Assessment: Report given to RN and Post -op Vital signs reviewed and stable  Post vital signs: Reviewed and stable  Last Vitals:  Vitals Value Taken Time  BP    Temp    Pulse 69 06/09/22 1108  Resp 22 06/09/22 1108  SpO2 96 % 06/09/22 1108  Vitals shown include unvalidated device data.  Last Pain:  Vitals:   06/09/22 0818  TempSrc: Oral  PainSc: 0-No pain      Patients Stated Pain Goal: 5 (06/09/22 0818)  Complications: No notable events documented.

## 2022-06-09 NOTE — Anesthesia Preprocedure Evaluation (Signed)
Anesthesia Evaluation  Patient identified by MRN, date of birth, ID band Patient awake    Reviewed: Allergy & Precautions, NPO status , Patient's Chart, lab work & pertinent test results  Airway Mallampati: II  TM Distance: >3 FB Neck ROM: Full    Dental  (+) Dental Advisory Given   Pulmonary neg pulmonary ROS   breath sounds clear to auscultation       Cardiovascular hypertension, Pt. on medications  Rhythm:Regular Rate:Normal     Neuro/Psych  Headaches    GI/Hepatic negative GI ROS, Neg liver ROS,,,  Endo/Other  negative endocrine ROS    Renal/GU negative Renal ROS     Musculoskeletal  (+) Arthritis ,    Abdominal   Peds  Hematology negative hematology ROS (+)   Anesthesia Other Findings   Reproductive/Obstetrics                             Anesthesia Physical Anesthesia Plan  ASA: 2  Anesthesia Plan: General   Post-op Pain Management: Tylenol PO (pre-op)* and Toradol IV (intra-op)*   Induction: Intravenous  PONV Risk Score and Plan: 3 and Midazolam, Dexamethasone, Ondansetron and Treatment may vary due to age or medical condition  Airway Management Planned: LMA  Additional Equipment:   Intra-op Plan:   Post-operative Plan: Extubation in OR  Informed Consent: I have reviewed the patients History and Physical, chart, labs and discussed the procedure including the risks, benefits and alternatives for the proposed anesthesia with the patient or authorized representative who has indicated his/her understanding and acceptance.     Dental advisory given  Plan Discussed with: CRNA  Anesthesia Plan Comments:        Anesthesia Quick Evaluation

## 2022-06-10 ENCOUNTER — Encounter (HOSPITAL_BASED_OUTPATIENT_CLINIC_OR_DEPARTMENT_OTHER): Payer: Self-pay | Admitting: Surgery

## 2022-06-13 LAB — SURGICAL PATHOLOGY

## 2022-06-22 DIAGNOSIS — M48061 Spinal stenosis, lumbar region without neurogenic claudication: Secondary | ICD-10-CM | POA: Diagnosis not present

## 2022-06-22 DIAGNOSIS — M4807 Spinal stenosis, lumbosacral region: Secondary | ICD-10-CM | POA: Diagnosis not present

## 2022-06-22 DIAGNOSIS — M47816 Spondylosis without myelopathy or radiculopathy, lumbar region: Secondary | ICD-10-CM | POA: Diagnosis not present

## 2022-06-22 DIAGNOSIS — M5136 Other intervertebral disc degeneration, lumbar region: Secondary | ICD-10-CM | POA: Diagnosis not present

## 2022-06-22 DIAGNOSIS — M47817 Spondylosis without myelopathy or radiculopathy, lumbosacral region: Secondary | ICD-10-CM | POA: Diagnosis not present

## 2022-06-22 DIAGNOSIS — M5137 Other intervertebral disc degeneration, lumbosacral region: Secondary | ICD-10-CM | POA: Diagnosis not present

## 2022-06-22 DIAGNOSIS — M2578 Osteophyte, vertebrae: Secondary | ICD-10-CM | POA: Diagnosis not present

## 2022-06-22 DIAGNOSIS — M1612 Unilateral primary osteoarthritis, left hip: Secondary | ICD-10-CM | POA: Diagnosis not present

## 2022-06-22 DIAGNOSIS — M4316 Spondylolisthesis, lumbar region: Secondary | ICD-10-CM | POA: Diagnosis not present

## 2022-06-22 DIAGNOSIS — M461 Sacroiliitis, not elsewhere classified: Secondary | ICD-10-CM | POA: Diagnosis not present

## 2022-07-01 ENCOUNTER — Ambulatory Visit
Admission: EM | Admit: 2022-07-01 | Discharge: 2022-07-01 | Disposition: A | Discharge status: Home / Self Care | Payer: 59 | Attending: Nurse Practitioner | Admitting: Nurse Practitioner

## 2022-07-01 ENCOUNTER — Ambulatory Visit (INDEPENDENT_AMBULATORY_CARE_PROVIDER_SITE_OTHER): Payer: 59

## 2022-07-01 DIAGNOSIS — H66001 Acute suppurative otitis media without spontaneous rupture of ear drum, right ear: Secondary | ICD-10-CM | POA: Diagnosis not present

## 2022-07-01 DIAGNOSIS — R059 Cough, unspecified: Secondary | ICD-10-CM | POA: Diagnosis not present

## 2022-07-01 DIAGNOSIS — R051 Acute cough: Secondary | ICD-10-CM

## 2022-07-01 DIAGNOSIS — J9811 Atelectasis: Secondary | ICD-10-CM | POA: Diagnosis not present

## 2022-07-01 DIAGNOSIS — J209 Acute bronchitis, unspecified: Secondary | ICD-10-CM

## 2022-07-01 MED ORDER — AMOXICILLIN 875 MG PO TABS
875.0000 mg | ORAL_TABLET | Freq: Two times a day (BID) | ORAL | 0 refills | Status: DC
Start: 2022-07-01 — End: 2022-07-01

## 2022-07-01 MED ORDER — PROMETHAZINE-DM 6.25-15 MG/5ML PO SYRP
5.0000 mL | ORAL_SOLUTION | Freq: Four times a day (QID) | ORAL | 0 refills | Status: DC | PRN
Start: 2022-07-01 — End: 2022-07-01

## 2022-07-01 MED ORDER — AMOXICILLIN 875 MG PO TABS
875.0000 mg | ORAL_TABLET | Freq: Two times a day (BID) | ORAL | 0 refills | Status: AC
Start: 2022-07-01 — End: 2022-07-11

## 2022-07-01 MED ORDER — PROMETHAZINE-DM 6.25-15 MG/5ML PO SYRP
5.0000 mL | ORAL_SOLUTION | Freq: Four times a day (QID) | ORAL | 0 refills | Status: DC | PRN
Start: 2022-07-01 — End: 2023-08-22

## 2022-07-01 NOTE — ED Provider Notes (Signed)
Annette Rojas    CSN: [id number] Arrival date & time: 07/01/22  1622      History   Chief Complaint Chief Complaint  Patient presents with   Cough   Headache    HPI Annette Rojas is a 57 y.o. female  presents for evaluation of URI symptoms for 7 days. Patient reports associated symptoms of cough, congestion, chills, ear pain, dizziness, HA, and laryngitis. Denies N/V/D, documented fevers, SOB. Patient does not have a hx of asthma or smoking. Husband has COVID.   Pt has taken "everything" OTC for symptoms. Pt has no other concerns at this time.    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    Past Surgical History:  Procedure Laterality Date   BREAST BIOPSY Right 01/13/2021   [place] bx, heart marker, path pending   BREAST BIOPSY  06/08/2022   MM RT RADIOACTIVE SEED LOC MAMMO GUIDE 06/08/2022 [facility]   BREAST BIOPSY  06/08/2022   MM LT RADIOACTIVE SEED LOC MAMMO GUIDE 06/08/2022 [facility]   BREAST LUMPECTOMY WITH RADIOACTIVE SEED LOCALIZATION Bilateral 06/09/2022   Procedure: BILATERAL BREAST LUMPECTOMY WITH RADIOACTIVE SEED LOCALIZATION;  Surgeon: Manus Rudd, MD;  [facility]  [facility];  Service: General;  Laterality: Bilateral;   COLONOSCOPY      OB History   No obstetric history on file.      Home Medications    Prior to Admission medications   Medication Sig Start Date End Date Taking? Authorizing Provider  ALPRAZolam Prudy Feeler) 0.25 MG tablet Take 0.25 mg by mouth as  needed for anxiety.    [provider]  amoxicillin (AMOXIL) 875 MG tablet Take 1 tablet (875 mg total) by mouth 2 (two) times daily for 10 days. 07/01/22 07/11/22 Yes Radford Pax, NP  atenolol (TENORMIN) 25 MG tablet Take 25 mg by mouth at bedtime.    [provider]  baclofen (LIORESAL) 10 MG tablet Take 1 tablet (10 mg total) by mouth every 8 (eight) hours as needed for muscle spasms (Pain). 05/09/22   Tyrell Antonio, MD  diphenhydrAMINE HCl (BENADRYL PO) Take 1 tablet by mouth daily as needed.    [provider]  FLUoxetine (PROZAC) 20 MG capsule Take 20 mg by mouth daily. 06/04/21   [provider]  ibuprofen (ADVIL) 600 MG tablet Take 600 mg by mouth every 6 (six) hours as needed.    [provider]  MELATONIN PO Take 1 tablet by mouth at bedtime.    [provider]  promethazine-dextromethorphan (PROMETHAZINE-DM) 6.25-15 MG/5ML syrup Take 5 mLs by mouth 4 (four) times daily as needed for cough. 07/01/22  Yes Radford Pax, NP  traZODone (DESYREL) 50 MG tablet Take 50 mg by mouth at bedtime. 04/05/22   [provider]    Family History Family History  Problem Relation Age of Onset   Colon polyps Mother 72   Colon cancer Mother 81   Colon  cancer Maternal Aunt    Colon cancer Maternal Uncle    Breast cancer Paternal Aunt 93   Breast cancer Paternal Aunt 42   Breast cancer Cousin 50       maternal side   Esophageal cancer Neg Hx    Stomach cancer Neg Hx    Rectal cancer Neg Hx     Social History Social History   Tobacco Use   Smoking status: Never   Smokeless tobacco: Never  Vaping Use   Vaping Use: Never used  Substance Use Topics   Alcohol use: Not Currently    Alcohol/week: 0.0 - 1.0 standard drinks of alcohol    Comment: occ.   Drug use: Never     Allergies   Patient has no known allergies.   Review of Systems Review of Systems  Constitutional:  Positive for chills.  HENT:  Positive for congestion and  voice change.   Respiratory:  Positive for cough.   Neurological:  Positive for headaches.     Physical Exam Triage Vital Signs ED Triage Vitals  Enc Vitals Group     BP 07/01/22 1741 (!) 142/77     Pulse Rate 07/01/22 1741 80     Resp 07/01/22 1741 18     Temp 07/01/22 1741 98 F (36.7 C)     Temp src --      SpO2 07/01/22 1741 96 %     Weight --      Height --      Head Circumference --      Peak Flow --      Pain Score 07/01/22 1743 8     Pain Loc --      Pain Edu? --      Excl. in GC? --    No data found.  Updated Vital Signs BP (!) 142/77   Pulse 80   Temp 98 F (36.7 C)   Resp 18   LMP 08/23/2015   SpO2 96%   Visual Acuity Right Eye Distance:   Left Eye Distance:   Bilateral Distance:    Right Eye Near:   Left Eye Near:    Bilateral Near:     Physical Exam Vitals and nursing note reviewed.  Constitutional:      General: She is not in acute distress.    Appearance: She is well-developed. She is not ill-appearing.  HENT:     Head: Normocephalic and atraumatic.     Right Ear: Ear canal normal. Tympanic membrane is erythematous.     Left Ear: Ear canal normal. A middle ear effusion is present. Tympanic membrane is not erythematous.     Nose: Congestion present.     Mouth/Throat:     Mouth: Mucous membranes are moist.     Pharynx: Oropharynx is clear. Uvula midline. No oropharyngeal exudate or posterior oropharyngeal erythema.     Tonsils: No tonsillar exudate or tonsillar abscesses.  Eyes:     Conjunctiva/sclera: Conjunctivae normal.     Pupils: Pupils are equal, round, and reactive to light.  Cardiovascular:     Rate and Rhythm: Normal rate and regular rhythm.     Heart sounds: Normal heart sounds.  Pulmonary:     Effort: Pulmonary effort is normal.     Breath sounds: Normal breath sounds.  Musculoskeletal:     Cervical back: Normal range of motion and neck supple.  Lymphadenopathy:     Cervical: No cervical adenopathy.  Skin:    General:  Skin is warm  and dry.  Neurological:     General: No focal deficit present.     Mental Status: She is alert and oriented to person, place, and time.  Psychiatric:        Mood and Affect: Mood normal.        Behavior: Behavior normal.      UC Treatments / Results  Labs (all labs ordered are listed, but only abnormal results are displayed) Labs Reviewed - No data to display  EKG   Radiology DG Chest 2 View  Result Date: 07/01/2022 CLINICAL DATA:  Cough.  History of COVID exposure EXAM: CHEST - 2 VIEW COMPARISON:  None Available. FINDINGS: Minimal linear opacity left lung base likely scar or atelectasis. No consolidation, pneumothorax or effusion. No edema. Normal cardiopericardial silhouette. Mild degenerative changes seen of the spine on lateral view. IMPRESSION: Minimal left basilar atelectasis. Electronically Signed   By: Karen Kays M.D.   On: 07/01/2022 18:16    Procedures Procedures (including critical care time)  Medications Ordered in UC Medications - No data to display  Initial Impression / Assessment and Plan / UC Course  I have reviewed the triage vital signs and the nursing notes.  Pertinent labs & imaging results that were available during my care of the patient were reviewed by me and considered in my medical decision making (see chart for details).     Reviewed exam and sx with patient. No red flags.  Start Amoxil for ROM Promethazine DM as needed.  Side effect profile reviewed Advised patient to start over-the-counter Flonase daily Rest and fluids PCP follow-up if symptoms do not improve ER precautions reviewed Final Clinical Impressions(s) / UC Diagnoses   Final diagnoses:  Acute cough  Acute suppurative otitis media of right ear without spontaneous rupture of tympanic membrane, recurrence not specified  Acute bronchitis, unspecified organism     Discharge Instructions      Amoxil twice daily for 10 days Promethazine DM as needed for cough.   This medication can make you drowsy.  Do not drink alcohol or drive while on this medication Start Flonase over-the-counter daily Rest and fluids Follow-up with your PCP if your symptoms do not improve Please go to emergency room if you have any worsening symptoms     ED Prescriptions     Medication Sig Dispense Auth. Provider   amoxicillin (AMOXIL) 875 MG tablet Take 1 tablet (875 mg total) by mouth 2 (two) times daily for 10 days. 20 tablet Radford Pax, NP   promethazine-dextromethorphan (PROMETHAZINE-DM) 6.25-15 MG/5ML syrup Take 5 mLs by mouth 4 (four) times daily as needed for cough. 118 mL Radford Pax, NP      PDMP not reviewed this encounter.   Radford Pax, NP 07/01/22 [phone]

## 2022-07-01 NOTE — Discharge Instructions (Addendum)
Amoxil twice daily for 10 days Promethazine DM as needed for cough.  This medication can make you drowsy.  Do not drink alcohol or drive while on this medication Start Flonase over-the-counter daily Rest and fluids Follow-up with your PCP if your symptoms do not improve Please go to emergency room if you have any worsening symptoms

## 2022-07-01 NOTE — ED Triage Notes (Signed)
Patient to Urgent Care with complaints of Covid exposure. Reports sinus pain/ pressure/ cough/ headaches/ chills/ generalized body aches/ hoarseness. Bilateral ear fullness. Reports she has started having some vertigo/ dizziness.   Symptoms started Sunday night. Reports her husband has been sick w/ covid since 4/22.   Has been pushing fluids/ taking zinc/ tylenol/ motrin. Mucinex.

## 2022-07-12 DIAGNOSIS — M5432 Sciatica, left side: Secondary | ICD-10-CM | POA: Diagnosis not present

## 2022-07-12 DIAGNOSIS — M5106 Intervertebral disc disorders with myelopathy, lumbar region: Secondary | ICD-10-CM | POA: Diagnosis not present

## 2022-07-12 DIAGNOSIS — M5136 Other intervertebral disc degeneration, lumbar region: Secondary | ICD-10-CM | POA: Diagnosis not present

## 2022-09-05 DIAGNOSIS — G43909 Migraine, unspecified, not intractable, without status migrainosus: Secondary | ICD-10-CM | POA: Insufficient documentation

## 2022-09-05 DIAGNOSIS — Z01818 Encounter for other preprocedural examination: Secondary | ICD-10-CM | POA: Diagnosis not present

## 2022-09-05 DIAGNOSIS — M48062 Spinal stenosis, lumbar region with neurogenic claudication: Secondary | ICD-10-CM | POA: Insufficient documentation

## 2022-09-05 DIAGNOSIS — F329 Major depressive disorder, single episode, unspecified: Secondary | ICD-10-CM | POA: Insufficient documentation

## 2022-09-05 DIAGNOSIS — I10 Essential (primary) hypertension: Secondary | ICD-10-CM | POA: Diagnosis not present

## 2022-09-05 DIAGNOSIS — M5432 Sciatica, left side: Secondary | ICD-10-CM | POA: Diagnosis not present

## 2022-09-05 DIAGNOSIS — M5136 Other intervertebral disc degeneration, lumbar region: Secondary | ICD-10-CM | POA: Diagnosis not present

## 2022-09-05 DIAGNOSIS — G43809 Other migraine, not intractable, without status migrainosus: Secondary | ICD-10-CM | POA: Diagnosis not present

## 2022-09-11 DIAGNOSIS — M51369 Other intervertebral disc degeneration, lumbar region without mention of lumbar back pain or lower extremity pain: Secondary | ICD-10-CM | POA: Insufficient documentation

## 2022-09-11 DIAGNOSIS — M5106 Intervertebral disc disorders with myelopathy, lumbar region: Secondary | ICD-10-CM | POA: Insufficient documentation

## 2022-09-11 DIAGNOSIS — M4316 Spondylolisthesis, lumbar region: Secondary | ICD-10-CM | POA: Insufficient documentation

## 2022-09-11 DIAGNOSIS — M21372 Foot drop, left foot: Secondary | ICD-10-CM | POA: Insufficient documentation

## 2022-09-28 DIAGNOSIS — F419 Anxiety disorder, unspecified: Secondary | ICD-10-CM | POA: Diagnosis not present

## 2022-09-28 DIAGNOSIS — K219 Gastro-esophageal reflux disease without esophagitis: Secondary | ICD-10-CM | POA: Diagnosis not present

## 2022-09-28 DIAGNOSIS — M5136 Other intervertebral disc degeneration, lumbar region: Secondary | ICD-10-CM | POA: Diagnosis not present

## 2022-09-28 DIAGNOSIS — M4316 Spondylolisthesis, lumbar region: Secondary | ICD-10-CM | POA: Diagnosis not present

## 2022-09-28 DIAGNOSIS — M4807 Spinal stenosis, lumbosacral region: Secondary | ICD-10-CM | POA: Diagnosis not present

## 2022-09-28 DIAGNOSIS — R7401 Elevation of levels of liver transaminase levels: Secondary | ICD-10-CM | POA: Diagnosis not present

## 2022-09-28 DIAGNOSIS — Z981 Arthrodesis status: Secondary | ICD-10-CM | POA: Diagnosis not present

## 2022-09-28 DIAGNOSIS — Z4789 Encounter for other orthopedic aftercare: Secondary | ICD-10-CM | POA: Diagnosis not present

## 2022-09-28 DIAGNOSIS — M21372 Foot drop, left foot: Secondary | ICD-10-CM | POA: Diagnosis not present

## 2022-09-28 DIAGNOSIS — E669 Obesity, unspecified: Secondary | ICD-10-CM | POA: Diagnosis not present

## 2022-09-28 DIAGNOSIS — F329 Major depressive disorder, single episode, unspecified: Secondary | ICD-10-CM | POA: Diagnosis not present

## 2022-09-28 DIAGNOSIS — G43809 Other migraine, not intractable, without status migrainosus: Secondary | ICD-10-CM | POA: Diagnosis not present

## 2022-09-28 DIAGNOSIS — M5432 Sciatica, left side: Secondary | ICD-10-CM | POA: Diagnosis not present

## 2022-09-28 DIAGNOSIS — M48061 Spinal stenosis, lumbar region without neurogenic claudication: Secondary | ICD-10-CM | POA: Diagnosis not present

## 2022-09-28 DIAGNOSIS — M48062 Spinal stenosis, lumbar region with neurogenic claudication: Secondary | ICD-10-CM | POA: Diagnosis not present

## 2022-09-28 DIAGNOSIS — I10 Essential (primary) hypertension: Secondary | ICD-10-CM | POA: Diagnosis not present

## 2022-09-28 DIAGNOSIS — M5137 Other intervertebral disc degeneration, lumbosacral region: Secondary | ICD-10-CM | POA: Diagnosis not present

## 2022-09-28 DIAGNOSIS — E876 Hypokalemia: Secondary | ICD-10-CM | POA: Diagnosis not present

## 2022-09-28 DIAGNOSIS — M5106 Intervertebral disc disorders with myelopathy, lumbar region: Secondary | ICD-10-CM | POA: Diagnosis not present

## 2022-09-28 DIAGNOSIS — E6609 Other obesity due to excess calories: Secondary | ICD-10-CM | POA: Diagnosis not present

## 2022-09-28 DIAGNOSIS — G43909 Migraine, unspecified, not intractable, without status migrainosus: Secondary | ICD-10-CM | POA: Diagnosis not present

## 2022-09-28 DIAGNOSIS — M4317 Spondylolisthesis, lumbosacral region: Secondary | ICD-10-CM | POA: Diagnosis not present

## 2022-09-28 DIAGNOSIS — M5127 Other intervertebral disc displacement, lumbosacral region: Secondary | ICD-10-CM | POA: Diagnosis not present

## 2022-09-29 DIAGNOSIS — I10 Essential (primary) hypertension: Secondary | ICD-10-CM | POA: Diagnosis not present

## 2022-09-29 DIAGNOSIS — G43809 Other migraine, not intractable, without status migrainosus: Secondary | ICD-10-CM | POA: Diagnosis not present

## 2022-09-29 DIAGNOSIS — F329 Major depressive disorder, single episode, unspecified: Secondary | ICD-10-CM | POA: Diagnosis not present

## 2022-09-30 DIAGNOSIS — M5106 Intervertebral disc disorders with myelopathy, lumbar region: Secondary | ICD-10-CM | POA: Diagnosis not present

## 2022-09-30 DIAGNOSIS — E876 Hypokalemia: Secondary | ICD-10-CM | POA: Diagnosis not present

## 2022-09-30 DIAGNOSIS — R748 Abnormal levels of other serum enzymes: Secondary | ICD-10-CM | POA: Diagnosis not present

## 2022-09-30 DIAGNOSIS — F329 Major depressive disorder, single episode, unspecified: Secondary | ICD-10-CM | POA: Diagnosis not present

## 2022-09-30 DIAGNOSIS — I10 Essential (primary) hypertension: Secondary | ICD-10-CM | POA: Diagnosis not present

## 2022-10-17 ENCOUNTER — Other Ambulatory Visit: Payer: Self-pay | Admitting: Orthopedic Surgery

## 2022-10-17 DIAGNOSIS — M48062 Spinal stenosis, lumbar region with neurogenic claudication: Secondary | ICD-10-CM

## 2022-10-25 ENCOUNTER — Ambulatory Visit: Payer: 59

## 2022-10-25 DIAGNOSIS — M48062 Spinal stenosis, lumbar region with neurogenic claudication: Secondary | ICD-10-CM | POA: Diagnosis not present

## 2022-11-28 ENCOUNTER — Other Ambulatory Visit: Payer: Self-pay | Admitting: Orthopedic Surgery

## 2022-11-28 DIAGNOSIS — Z981 Arthrodesis status: Secondary | ICD-10-CM

## 2022-11-28 DIAGNOSIS — M51369 Other intervertebral disc degeneration, lumbar region without mention of lumbar back pain or lower extremity pain: Secondary | ICD-10-CM

## 2022-12-06 ENCOUNTER — Ambulatory Visit (INDEPENDENT_AMBULATORY_CARE_PROVIDER_SITE_OTHER): Payer: 59

## 2022-12-06 DIAGNOSIS — M51369 Other intervertebral disc degeneration, lumbar region without mention of lumbar back pain or lower extremity pain: Secondary | ICD-10-CM

## 2022-12-06 DIAGNOSIS — Z981 Arthrodesis status: Secondary | ICD-10-CM | POA: Diagnosis not present

## 2022-12-16 IMAGING — MR MR LUMBAR SPINE W/O CM
4 of 5 series · 18 of 48 positions shown · non-contrast
Comparison: Lumbar spine radiograph dated April 20, 2021

CLINICAL DATA: Lumbar radiculopathy.

EXAM:
MRI LUMBAR SPINE WITHOUT CONTRAST
TECHNIQUE: Multiplanar, multisequence MR imaging of the lumbar spine was
performed. No intravenous contrast was administered.

[Series 2: T2 · sagittal · 4.0mm · 0.55mm/px · 5 of 15 slices shown (1 of 2)]
[im 1/15]
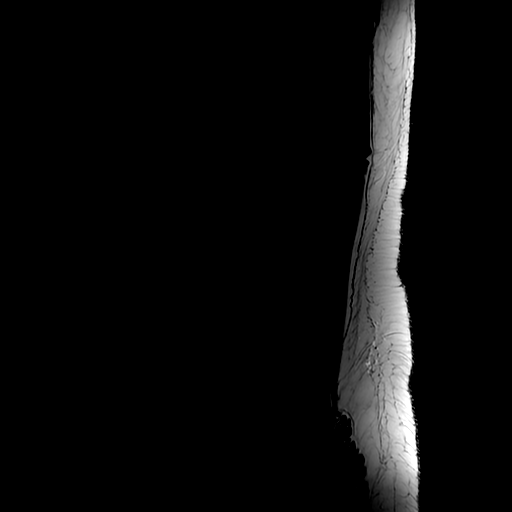
[im 4/15]
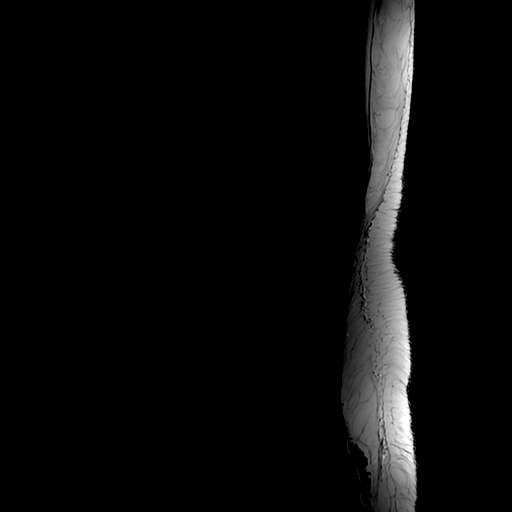
[im 8/15]
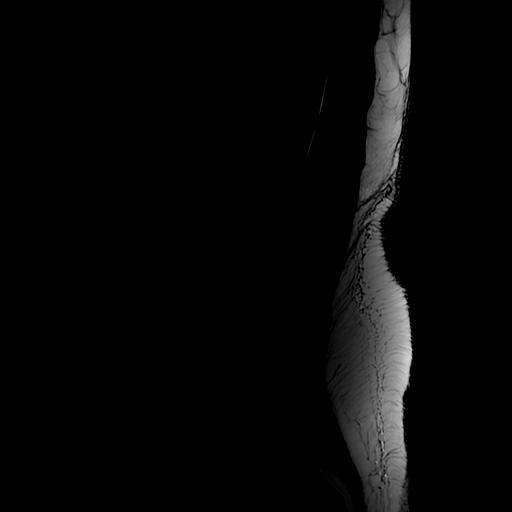
[im 11/15]
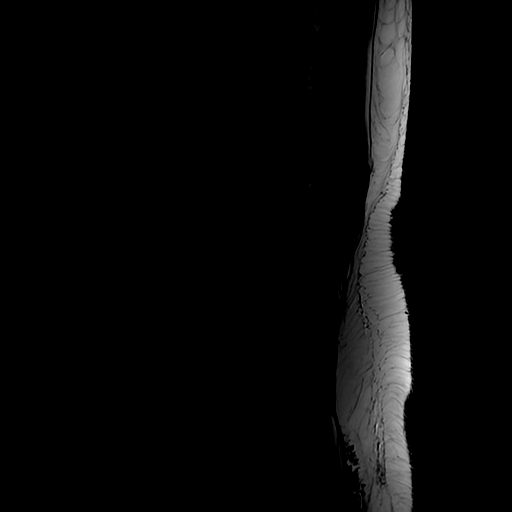
[im 15/15]
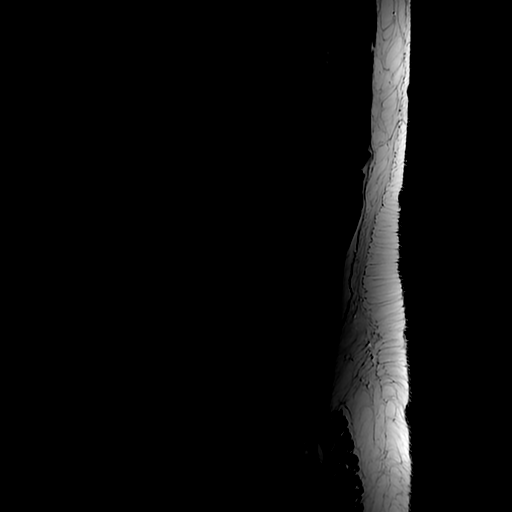

[Series 4: T1 · sagittal · 4.0mm · 0.55mm/px · 3 of 15 slices shown (1 of 2)]
[im 3/15]
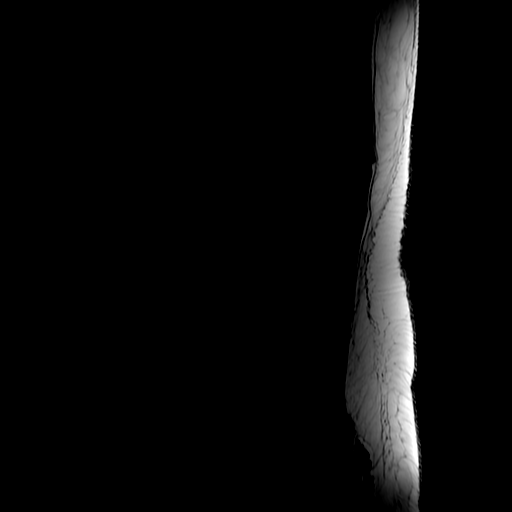
[im 9/15]
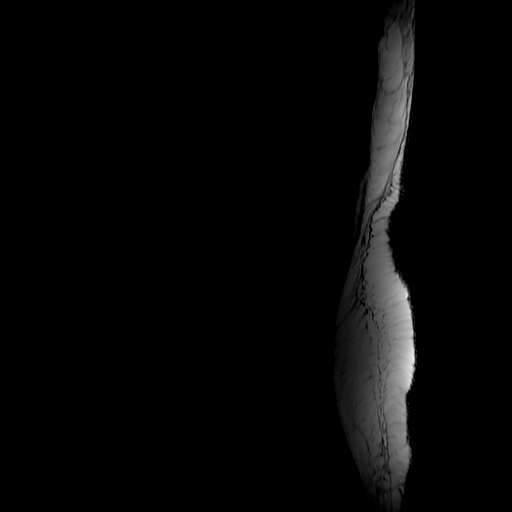
[im 15/15]
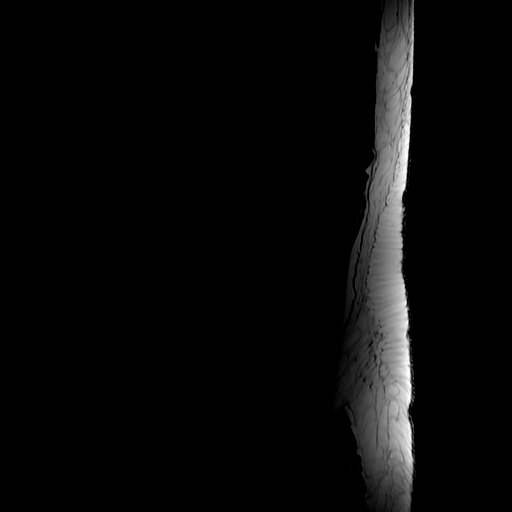

[Series 5: T2 · axial · 4.0mm · 0.39mm/px · z∈[-69,+105]mm · 7 of 42 slices shown (2 of 2)]
[im 3/42]
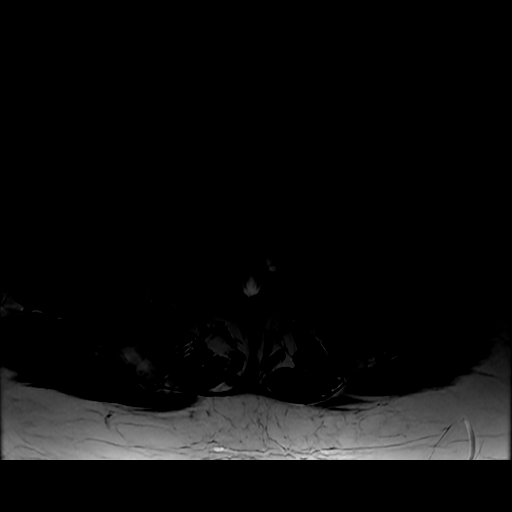
[im 6/42]
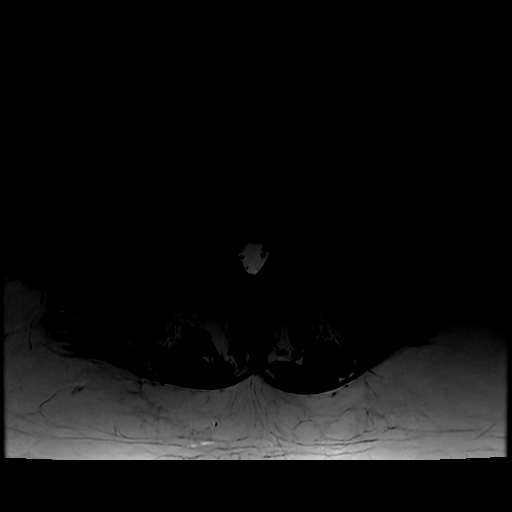
[im 9/42]
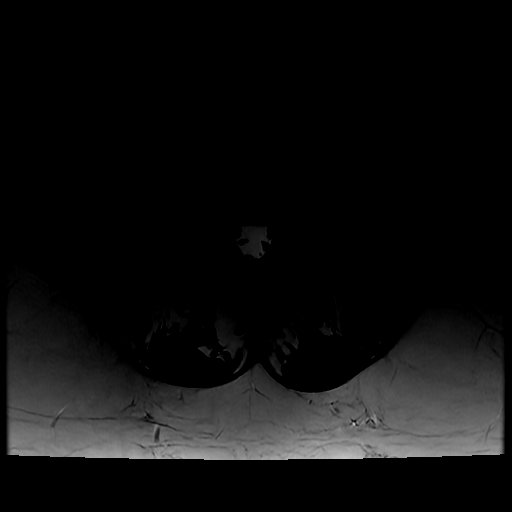
[im 14/42]
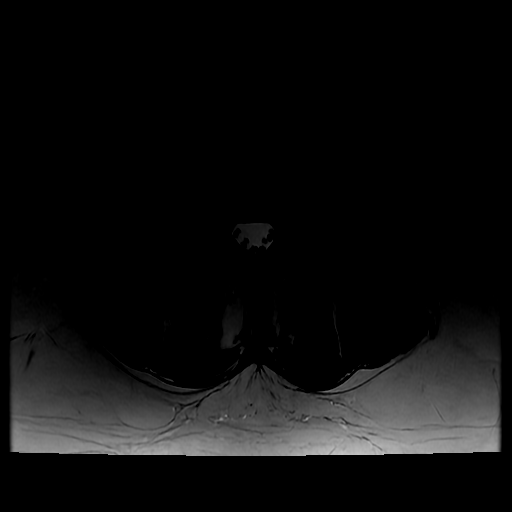
[im 20/42]
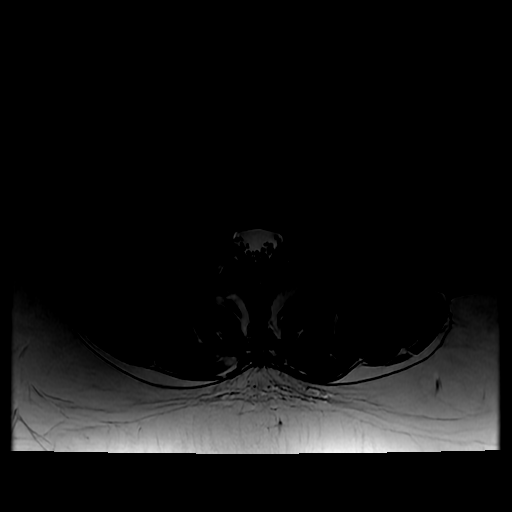
[im 22/42]
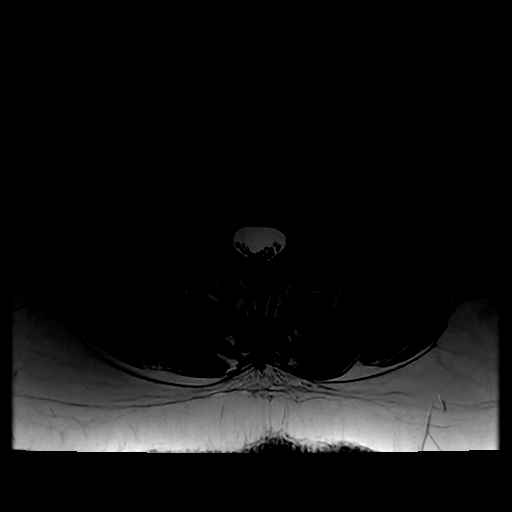
[im 36/42]
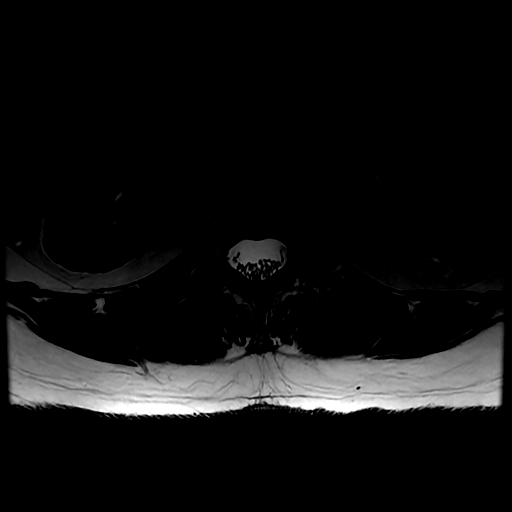

[Series 6: T1 · axial · 4.0mm · 0.39mm/px · z∈[-54,+105]mm · 3 of 42 slices shown (2 of 2)]
[im 6/42]
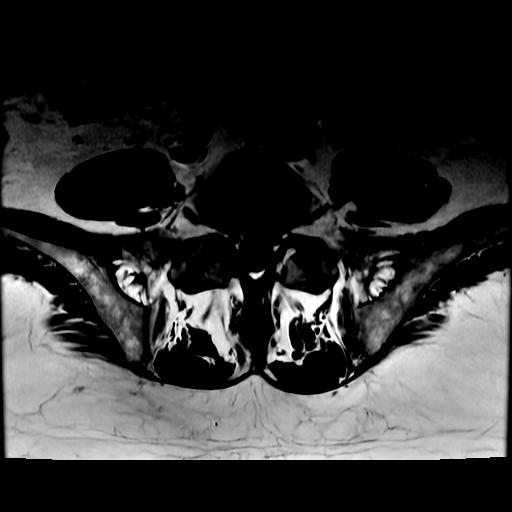
[im 22/42]
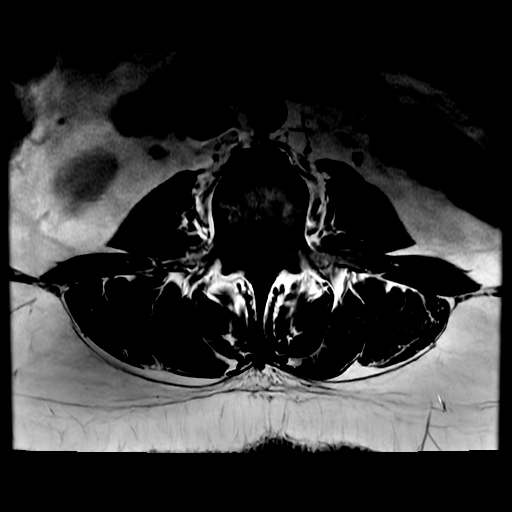
[im 36/42]
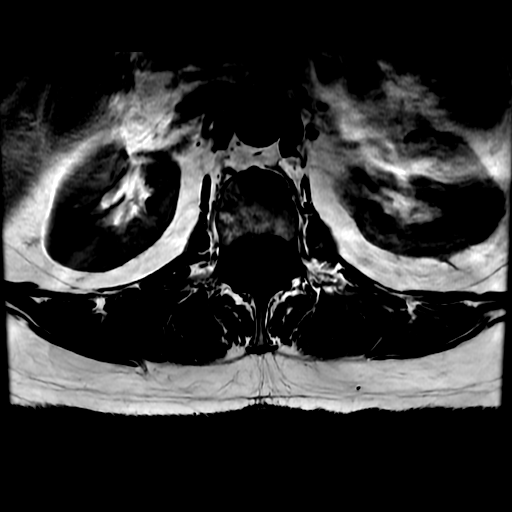

[18 of 48 positions shown; findings below may reference images not displayed]

FINDINGS: Segmentation:  Standard.

Alignment:  Mild retrolisthesis of L2, L3, L4 and L5.

Vertebrae:  No fracture, evidence of discitis, or bone lesion.

Conus medullaris and cauda equina: Conus extends to the L1 level.
Conus and cauda equina appear normal.

Paraspinal and other soft tissues: Negative.

Disc levels:

T12-L1: Asymmetric disc protrusion to the right without significant
spinal canal or neural foraminal narrowing mild facet joint
arthropathy.

L1-L2: No significant disc bulge. No neural foraminal stenosis. No
central canal stenosis. Mild facet joint arthropathy.

L2-L3: Circumferential disc protrusion and ligamentum flavum
hypertrophy with mild narrowing of subarticular recess. Mild facet
joint arthropathy. No significant spinal canal or neural foraminal
narrowing.

L3-L4: Mild circumferential disc protrusion with narrowing of
lateral recesses. Ligamentum flavum hypertrophy. Moderate bilateral
facet joint arthropathy. No significant neural foraminal narrowing.

L4-L5: Disc protrusion with bilateral subarticular recess narrowing.
Mild ligamentum flavum hypertrophy. Moderate facet joint
arthropathy. No significant neural foraminal narrowing.

L5-S1: Moderate to severe disc protrusion and ligamentum flavum
hypertrophy with narrowing of spinal canal. Bilateral lateral recess
narrowing with encroachment of the S1 nerve roots bilaterally. Mild
bilateral neural foraminal narrowing at L5. Moderate to severe facet
joint arthropathy.
IMPRESSION: 1.  No evidence of acute fracture or subluxation.

2. Moderate multilevel degenerative disc disease most prominent at
L5-S1 with narrowing of bilateral recesses and encroachment of the
S1 roots as well as mild bilateral neural foraminal narrowing at L5.

3. Advanced multilevel facet joint arthropathy prominent at L4-L5
and L5-S1.

5.  Distal spinal cord and cauda equina are within normal limits.

## 2023-01-09 ENCOUNTER — Other Ambulatory Visit: Payer: Self-pay | Admitting: Surgery

## 2023-01-09 DIAGNOSIS — Z1231 Encounter for screening mammogram for malignant neoplasm of breast: Secondary | ICD-10-CM

## 2023-01-20 DIAGNOSIS — E785 Hyperlipidemia, unspecified: Secondary | ICD-10-CM | POA: Diagnosis not present

## 2023-01-20 DIAGNOSIS — A881 Epidemic vertigo: Secondary | ICD-10-CM | POA: Diagnosis not present

## 2023-01-29 HISTORY — PX: BASAL CELL CARCINOMA EXCISION: SHX1214

## 2023-01-30 DIAGNOSIS — M51379 Other intervertebral disc degeneration, lumbosacral region without mention of lumbar back pain or lower extremity pain: Secondary | ICD-10-CM | POA: Diagnosis not present

## 2023-01-31 DIAGNOSIS — Z981 Arthrodesis status: Secondary | ICD-10-CM | POA: Diagnosis not present

## 2023-01-31 DIAGNOSIS — M5136 Other intervertebral disc degeneration, lumbar region with discogenic back pain only: Secondary | ICD-10-CM | POA: Diagnosis not present

## 2023-02-02 ENCOUNTER — Ambulatory Visit
Admission: RE | Admit: 2023-02-02 | Discharge: 2023-02-02 | Disposition: A | Discharge status: Home / Self Care | Payer: 59 | Source: Ambulatory Visit | Attending: Surgery | Admitting: Surgery

## 2023-02-02 DIAGNOSIS — Z1231 Encounter for screening mammogram for malignant neoplasm of breast: Secondary | ICD-10-CM | POA: Diagnosis not present

## 2023-02-07 DIAGNOSIS — L448 Other specified papulosquamous disorders: Secondary | ICD-10-CM | POA: Diagnosis not present

## 2023-02-07 DIAGNOSIS — C44311 Basal cell carcinoma of skin of nose: Secondary | ICD-10-CM | POA: Diagnosis not present

## 2023-02-07 DIAGNOSIS — L578 Other skin changes due to chronic exposure to nonionizing radiation: Secondary | ICD-10-CM | POA: Diagnosis not present

## 2023-02-07 DIAGNOSIS — D485 Neoplasm of uncertain behavior of skin: Secondary | ICD-10-CM | POA: Diagnosis not present

## 2023-02-07 DIAGNOSIS — Z7189 Other specified counseling: Secondary | ICD-10-CM | POA: Diagnosis not present

## 2023-02-07 DIAGNOSIS — D1801 Hemangioma of skin and subcutaneous tissue: Secondary | ICD-10-CM | POA: Diagnosis not present

## 2023-02-13 DIAGNOSIS — Z124 Encounter for screening for malignant neoplasm of cervix: Secondary | ICD-10-CM | POA: Diagnosis not present

## 2023-02-13 DIAGNOSIS — N958 Other specified menopausal and perimenopausal disorders: Secondary | ICD-10-CM | POA: Insufficient documentation

## 2023-02-13 DIAGNOSIS — N950 Postmenopausal bleeding: Secondary | ICD-10-CM | POA: Diagnosis not present

## 2023-02-13 DIAGNOSIS — Z01411 Encounter for gynecological examination (general) (routine) with abnormal findings: Secondary | ICD-10-CM | POA: Diagnosis not present

## 2023-02-13 DIAGNOSIS — N841 Polyp of cervix uteri: Secondary | ICD-10-CM | POA: Insufficient documentation

## 2023-02-13 DIAGNOSIS — R2233 Localized swelling, mass and lump, upper limb, bilateral: Secondary | ICD-10-CM | POA: Diagnosis not present

## 2023-02-16 ENCOUNTER — Other Ambulatory Visit: Payer: Self-pay | Admitting: Obstetrics and Gynecology

## 2023-02-16 DIAGNOSIS — R2233 Localized swelling, mass and lump, upper limb, bilateral: Secondary | ICD-10-CM

## 2023-02-20 DIAGNOSIS — C44311 Basal cell carcinoma of skin of nose: Secondary | ICD-10-CM | POA: Diagnosis not present

## 2023-03-15 ENCOUNTER — Other Ambulatory Visit: Payer: Self-pay | Admitting: Medical Genetics

## 2023-03-20 ENCOUNTER — Other Ambulatory Visit
Admission: RE | Admit: 2023-03-20 | Discharge: 2023-03-20 | Disposition: A | Discharge status: Home / Self Care | Payer: Self-pay | Source: Ambulatory Visit | Attending: Medical Genetics | Admitting: Medical Genetics

## 2023-03-21 DIAGNOSIS — N950 Postmenopausal bleeding: Secondary | ICD-10-CM | POA: Diagnosis not present

## 2023-03-21 DIAGNOSIS — N841 Polyp of cervix uteri: Secondary | ICD-10-CM | POA: Diagnosis not present

## 2023-03-28 ENCOUNTER — Other Ambulatory Visit: Payer: 59

## 2023-03-31 LAB — GENECONNECT MOLECULAR SCREEN: Genetic Analysis Overall Interpretation: NEGATIVE

## 2023-04-07 DIAGNOSIS — M7061 Trochanteric bursitis, right hip: Secondary | ICD-10-CM | POA: Diagnosis not present

## 2023-05-09 DIAGNOSIS — G4489 Other headache syndrome: Secondary | ICD-10-CM | POA: Diagnosis not present

## 2023-05-09 DIAGNOSIS — F410 Panic disorder [episodic paroxysmal anxiety] without agoraphobia: Secondary | ICD-10-CM | POA: Diagnosis not present

## 2023-05-09 DIAGNOSIS — R5383 Other fatigue: Secondary | ICD-10-CM | POA: Diagnosis not present

## 2023-05-17 DIAGNOSIS — Z48817 Encounter for surgical aftercare following surgery on the skin and subcutaneous tissue: Secondary | ICD-10-CM | POA: Diagnosis not present

## 2023-05-27 ENCOUNTER — Encounter: Payer: Self-pay | Admitting: Physician Assistant

## 2023-05-27 ENCOUNTER — Telehealth: Admitting: Physician Assistant

## 2023-05-27 DIAGNOSIS — B3731 Acute candidiasis of vulva and vagina: Secondary | ICD-10-CM | POA: Diagnosis not present

## 2023-05-27 MED ORDER — FLUCONAZOLE 150 MG PO TABS
150.0000 mg | ORAL_TABLET | Freq: Once | ORAL | 0 refills | Status: AC
Start: 2023-05-27 — End: 2023-05-27

## 2023-05-27 NOTE — Progress Notes (Signed)
 E-Visit for Vaginal Symptoms ? ?We are sorry that you are not feeling well. Here is how we plan to help! ?Based on what you shared with me it looks like you: May have a yeast vaginosis ? ?Vaginosis is an inflammation of the vagina that can result in discharge, itching and pain. The cause is usually a change in the normal balance of vaginal bacteria or an infection. Vaginosis can also result from reduced estrogen levels after menopause. ? ?The most common causes of vaginosis are: ? ? Bacterial vaginosis which results from an overgrowth of one on several organisms that are normally present in your vagina. ? ? Yeast infections which are caused by a naturally occurring fungus called candida. ? ? Vaginal atrophy (atrophic vaginosis) which results from the thinning of the vagina from reduced estrogen levels after menopause. ? ? Trichomoniasis which is caused by a parasite and is commonly transmitted by sexual intercourse. ? ?Factors that increase your risk of developing vaginosis include: ?Medications, such as antibiotics and steroids ?Uncontrolled diabetes ?Use of hygiene products such as bubble bath, vaginal spray or vaginal deodorant ?Douching ?Wearing damp or tight-fitting clothing ?Using an intrauterine device (IUD) for birth control ?Hormonal changes, such as those associated with pregnancy, birth control pills or menopause ?Sexual activity ?Having a sexually transmitted infection ? ?Your treatment plan is A single Diflucan (fluconazole) 150mg  tablet once.  I have electronically sent this prescription into the pharmacy that you have chosen. ? ?Be sure to take all of the medication as directed. Stop taking any medication if you develop a rash, tongue swelling or shortness of breath. Mothers who are breast feeding should consider pumping and discarding their breast milk while on these antibiotics. However, there is no consensus that infant exposure at these doses would be harmful.  ?Remember that medication creams can  weaken latex condoms. ?. ? ? ?HOME CARE: ? ?Good hygiene may prevent some types of vaginosis from recurring and may relieve some symptoms: ? ?Avoid baths, hot tubs and whirlpool spas. Rinse soap from your outer genital area after a shower, and dry the area well to prevent irritation. Don't use scented or harsh soaps, such as those with deodorant or antibacterial action. ?Avoid irritants. These include scented tampons and pads. ?Wipe from front to back after using the toilet. Doing so avoids spreading fecal bacteria to your vagina. ? ?Other things that may help prevent vaginosis include: ? ?Don't douche. Your vagina doesn't require cleansing other than normal bathing. Repetitive douching disrupts the normal organisms that reside in the vagina and can actually increase your risk of vaginal infection. Douching won't clear up a vaginal infection. ?Use a latex condom. Both female and female latex condoms may help you avoid infections spread by sexual contact. ?Wear cotton underwear. Also wear pantyhose with a cotton crotch. If you feel comfortable without it, skip wearing underwear to bed. Yeast thrives in moist environments ?Your symptoms should improve in the next day or two. ? ?GET HELP RIGHT AWAY IF: ? ?You have pain in your lower abdomen ( pelvic area or over your ovaries) ?You develop nausea or vomiting ?You develop a fever ?Your discharge changes or worsens ?You have persistent pain with intercourse ?You develop shortness of breath, a rapid pulse, or you faint. ? ?These symptoms could be signs of problems or infections that need to be evaluated by a medical provider now. ? ?MAKE SURE YOU  ? ?Understand these instructions. ?Will watch your condition. ?Will get help right away if you are not  doing well or get worse. ? ?Thank you for choosing an e-visit. ? ?Your e-visit answers were reviewed by a board certified advanced clinical practitioner to complete your personal care plan. Depending upon the condition, your plan  could have included both over the counter or prescription medications. ? ?Please review your pharmacy choice. Make sure the pharmacy is open so you can pick up prescription now. If there is a problem, you may contact your provider through [employer] and have the prescription routed to another pharmacy.  Your safety is important to [place]. If you have drug allergies check your prescription carefully.  ? ?For the next 24 hours you can use MyChart to ask questions about today's visit, request a non-urgent call back, or ask for a work or school excuse. ?You will get an email in the next two days asking about your experience. I hope that your e-visit has been valuable and will speed your recovery. ?I have spent 5 minutes in review of e-visit questionnaire, review and updating patient chart, medical decision making and response to patient.  ? ?Sander Speckman S Mayers, PA-C ? ? ? ?

## 2023-06-06 ENCOUNTER — Other Ambulatory Visit: Payer: 59

## 2023-06-06 ENCOUNTER — Encounter: Payer: 59 | Admitting: Licensed Clinical Social Worker

## 2023-07-24 ENCOUNTER — Other Ambulatory Visit: Payer: Self-pay

## 2023-07-24 ENCOUNTER — Encounter: Payer: Self-pay | Admitting: Emergency Medicine

## 2023-07-24 ENCOUNTER — Ambulatory Visit: Admission: EM | Admit: 2023-07-24 | Discharge: 2023-07-24 | Disposition: A | Discharge status: Home / Self Care

## 2023-07-24 DIAGNOSIS — R300 Dysuria: Secondary | ICD-10-CM

## 2023-07-24 DIAGNOSIS — B349 Viral infection, unspecified: Secondary | ICD-10-CM | POA: Diagnosis not present

## 2023-07-24 LAB — POCT URINALYSIS DIP (MANUAL ENTRY)
Bilirubin, UA: NEGATIVE
Glucose, UA: NEGATIVE mg/dL
Ketones, POC UA: NEGATIVE mg/dL
Leukocytes, UA: NEGATIVE
Nitrite, UA: NEGATIVE
Protein Ur, POC: NEGATIVE mg/dL
Spec Grav, UA: 1.015
Urobilinogen, UA: 0.2 U/dL
pH, UA: 7

## 2023-07-24 LAB — POC COVID19/FLU A&B COMBO
Covid Antigen, POC: NEGATIVE
Influenza A Antigen, POC: NEGATIVE
Influenza B Antigen, POC: NEGATIVE

## 2023-07-24 MED ORDER — BENZONATATE 100 MG PO CAPS: 100.0000 mg | ORAL_CAPSULE | Freq: Three times a day (TID) | ORAL | 0 refills | Status: AC | PRN

## 2023-07-24 NOTE — ED Triage Notes (Addendum)
 Symptoms started 3 days ago. Patient was in [place].  Patient flew back Saturday with overall feeling bad, aching, coughing.  Patient feels rattling in chest, overall fatigue.  Also has sob and a headache.  Has taken mucus relief, nyquil, cough dm and tylenol  or ibuprofen  Patient also feels burning with urination

## 2023-07-24 NOTE — ED Provider Notes (Signed)
 Annette Rojas    CSN: [id number] Arrival date & time: 07/24/23  1153      History   Chief Complaint No chief complaint on file.   HPI Annette Rojas is a 58 y.o. female.  Patient presents with fatigue, body aches, cough, shortness of breath, headache x 3 days.  Her symptoms started after she returned from a trip to [place].  She has been treating her symptoms with OTC cold medication; none taken today but she took Tylenol  this morning at 0700.  Patient also presents with dysuria x 3 days.  She denies fever, chest pain, abdominal pain, hematuria.  The history is provided by the patient and medical records.    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    Past Surgical History:  Procedure Laterality Date   BREAST BIOPSY Right 01/13/2021   us  bx, heart marker, path pending   BREAST BIOPSY  06/08/2022   MM RT RADIOACTIVE SEED LOC MAMMO GUIDE 06/08/2022 [facility]   BREAST BIOPSY  06/08/2022   MM LT RADIOACTIVE SEED LOC MAMMO GUIDE 06/08/2022 [facility]   BREAST EXCISIONAL BIOPSY Right 05/2022   fibroadenoma   BREAST EXCISIONAL BIOPSY Left 05/2022   papilloma   BREAST LUMPECTOMY WITH RADIOACTIVE SEED LOCALIZATION Bilateral 06/09/2022   Procedure: BILATERAL BREAST LUMPECTOMY WITH RADIOACTIVE SEED LOCALIZATION;  Surgeon: Dareen Ebbing, MD;  [facility]  [facility];  Service: General;  Laterality: Bilateral;   COLONOSCOPY      OB History   No obstetric history on file.      Home Medications    Prior to Admission medications   Medication Sig Start Date End Date Taking? Authorizing  Provider  benzonatate (TESSALON) 100 MG capsule Take 1 capsule (100 mg total) by mouth 3 (three) times daily as needed for cough. 07/24/23  Yes Wellington Half, NP  ALPRAZolam (XANAX) 0.25 MG tablet Take 0.25 mg by mouth as needed for anxiety.    [provider]  atenolol (TENORMIN) 25 MG tablet Take 25 mg by mouth at bedtime.    [provider]  baclofen  (LIORESAL ) 10 MG tablet Take 1 tablet (10 mg total) by mouth every 8 (eight) hours as needed for muscle spasms (Pain). 05/09/22   Bridget Campion, MD  diphenhydrAMINE HCl (BENADRYL PO) Take 1 tablet by mouth daily as needed.    [provider]  FLUoxetine (PROZAC) 20 MG capsule Take 20 mg by mouth daily. 06/04/21   [provider]  ibuprofen (ADVIL) 600 MG tablet Take 600 mg by mouth every 6 (six) hours as needed.    [provider]  MELATONIN PO Take 1 tablet by mouth at bedtime.    [provider]  promethazine -dextromethorphan (PROMETHAZINE -DM) 6.25-15 MG/5ML syrup Take 5 mLs by mouth 4 (four) times daily as needed for cough. 07/01/22   Mayer, Jodi R, NP  rosuvastatin (CRESTOR) 20 MG tablet Take 20 mg by mouth at bedtime.    [provider]  traZODone (DESYREL) 50 MG tablet Take 50 mg by mouth at bedtime. 04/05/22  [provider]    Family History Family History  Problem Relation Age of Onset   Colon polyps Mother 66   Colon cancer Mother 82   Colon cancer Maternal Aunt    Colon cancer Maternal Uncle    Breast cancer Paternal Aunt 106   Breast cancer Paternal Aunt 78   Breast cancer Cousin 50       maternal side   Esophageal cancer Neg Hx    Stomach cancer Neg Hx    Rectal cancer Neg Hx     Social History Social History   Tobacco Use   Smoking status: Never   Smokeless tobacco: Never  Vaping Use   Vaping status: Never Used  Substance Use Topics   Alcohol use: Not Currently    Alcohol/week: 0.0 - 1.0 standard drinks of alcohol    Comment: occ.   Drug use:  Never     Allergies   Patient has no known allergies.   Review of Systems Review of Systems  Constitutional:  Positive for fatigue. Negative for chills and fever.  HENT:  Negative for ear pain and sore throat.   Respiratory:  Positive for cough and shortness of breath.   Cardiovascular:  Negative for chest pain and palpitations.  Gastrointestinal:  Negative for diarrhea and vomiting.  Neurological:  Positive for headaches. Negative for dizziness, weakness and numbness.     Physical Exam Triage Vital Signs ED Triage Vitals  Encounter Vitals Group     BP      Systolic BP Percentile      Diastolic BP Percentile      Pulse      Resp      Temp      Temp src      SpO2      Weight      Height      Head Circumference      Peak Flow      Pain Score      Pain Loc      Pain Education      Exclude from Growth Chart    No data found.  Updated Vital Signs BP 133/83 (BP Location: Left Arm)   Pulse 88   Temp (!) 100.7 F (38.2 C) (Oral)   Resp 20   LMP 08/23/2015   SpO2 95%   Visual Acuity Right Eye Distance:   Left Eye Distance:   Bilateral Distance:    Right Eye Near:   Left Eye Near:    Bilateral Near:     Physical Exam Constitutional:      General: She is not in acute distress. HENT:     Right Ear: Tympanic membrane normal.     Left Ear: Tympanic membrane normal.     Nose: Rhinorrhea present.     Mouth/Throat:     Mouth: Mucous membranes are moist.     Pharynx: Oropharynx is clear.  Cardiovascular:     Rate and Rhythm: Normal rate and regular rhythm.     Heart sounds: Normal heart sounds.  Pulmonary:     Effort: Pulmonary effort is normal. No respiratory distress.     Breath sounds: Normal breath sounds.  Neurological:     Mental Status: She is alert.      UC Treatments / Results  Labs (all labs ordered are listed, but only abnormal results are displayed) Labs Reviewed  POCT URINALYSIS DIP (MANUAL ENTRY) - Abnormal; Notable for the following  components:      Result  Value   Blood, UA trace-intact (*)    All other components within normal limits  POC COVID19/FLU A&B COMBO    EKG   Radiology No results found.  Procedures Procedures (including critical care time)  Medications Ordered in UC Medications - No data to display  Initial Impression / Assessment and Plan / UC Course  I have reviewed the triage vital signs and the nursing notes.  Pertinent labs & imaging results that were available during my care of the patient were reviewed by me and considered in my medical decision making (see chart for details).    Viral illness, dysuria.  No respiratory distress.  Lungs are clear and O2 sat is 95% on room air.  Rapid COVID and flu negative.  Treating symptomatically with Tessalon Perles, Tylenol  or ibuprofen as needed.  Plain Mucinex as needed, rest, hydration.  Education provided on viral illness.  Urine does not show signs of infection.  Education provided on dysuria.  Instructed patient to follow up with her PCP if her symptoms are not improving.  She agrees to plan of care.   Final Clinical Impressions(s) / UC Diagnoses   Final diagnoses:  Viral illness  Dysuria     Discharge Instructions      Your urine does not show signs of infection.    The COVID and flu tests are negative.   Take the Tessalon Perles as directed for cough.  Take Tylenol  or ibuprofen as needed for fever or discomfort.  Take plain Mucinex as needed for congestion.  Rest and keep yourself hydrated.    Follow-up with your primary care provider if your symptoms are not improving.       ED Prescriptions     Medication Sig Dispense Auth. Provider   benzonatate (TESSALON) 100 MG capsule Take 1 capsule (100 mg total) by mouth 3 (three) times daily as needed for cough. 21 capsule Wellington Half, NP      PDMP not reviewed this encounter.   Wellington Half, NP 07/24/23 1520

## 2023-07-24 NOTE — Discharge Instructions (Addendum)
 Your urine does not show signs of infection.    The COVID and flu tests are negative.   Take the Tessalon Perles as directed for cough.  Take Tylenol  or ibuprofen as needed for fever or discomfort.  Take plain Mucinex as needed for congestion.  Rest and keep yourself hydrated.    Follow-up with your primary care provider if your symptoms are not improving.

## 2023-08-21 ENCOUNTER — Other Ambulatory Visit: Payer: Self-pay

## 2023-08-21 ENCOUNTER — Emergency Department
Admission: EM | Admit: 2023-08-21 | Discharge: 2023-08-22 | Disposition: A | Discharge status: Home / Self Care | Attending: Emergency Medicine | Admitting: Emergency Medicine

## 2023-08-21 ENCOUNTER — Emergency Department

## 2023-08-21 DIAGNOSIS — I10 Essential (primary) hypertension: Secondary | ICD-10-CM | POA: Insufficient documentation

## 2023-08-21 DIAGNOSIS — R059 Cough, unspecified: Secondary | ICD-10-CM | POA: Diagnosis not present

## 2023-08-21 DIAGNOSIS — E876 Hypokalemia: Secondary | ICD-10-CM | POA: Insufficient documentation

## 2023-08-21 DIAGNOSIS — R000 Tachycardia, unspecified: Secondary | ICD-10-CM | POA: Diagnosis not present

## 2023-08-21 DIAGNOSIS — R0602 Shortness of breath: Secondary | ICD-10-CM | POA: Diagnosis not present

## 2023-08-21 DIAGNOSIS — J40 Bronchitis, not specified as acute or chronic: Secondary | ICD-10-CM | POA: Diagnosis not present

## 2023-08-21 DIAGNOSIS — K449 Diaphragmatic hernia without obstruction or gangrene: Secondary | ICD-10-CM | POA: Diagnosis not present

## 2023-08-21 LAB — BASIC METABOLIC PANEL WITH GFR
Anion gap: 14 (ref 5–15)
BUN: 7 mg/dL (ref 6–20)
CO2: 22 mmol/L (ref 22–32)
Calcium: 9.1 mg/dL (ref 8.9–10.3)
Chloride: 104 mmol/L (ref 98–111)
Creatinine, Ser: 0.85 mg/dL (ref 0.44–1.00)
GFR, Estimated: >60 mL/min (ref >60)
Glucose, Bld: 172 mg/dL — ABNORMAL HIGH (ref 70–99)
Potassium: 2.9 mmol/L — ABNORMAL LOW (ref 3.5–5.1)
Sodium: 140 mmol/L (ref 135–145)

## 2023-08-21 LAB — RESP PANEL BY RT-PCR (RSV, FLU A&B, COVID)  RVPGX2
Influenza A by PCR: NEGATIVE
Influenza B by PCR: NEGATIVE
Resp Syncytial Virus by PCR: NEGATIVE
SARS Coronavirus 2 by RT PCR: NEGATIVE

## 2023-08-21 LAB — CBC WITH DIFFERENTIAL/PLATELET
Abs Immature Granulocytes: 0.03 10*3/uL (ref 0.00–0.07)
Basophils Absolute: 0 10*3/uL (ref 0.0–0.1)
Basophils Relative: 0 %
Eosinophils Absolute: 0 10*3/uL (ref 0.0–0.5)
Eosinophils Relative: 1 %
HCT: 37.2 % (ref 36.0–46.0)
Hemoglobin: 12.6 g/dL (ref 12.0–15.0)
Immature Granulocytes: 1 %
Lymphocytes Relative: 19 %
Lymphs Abs: 1.1 10*3/uL (ref 0.7–4.0)
MCH: 30.8 pg (ref 26.0–34.0)
MCHC: 33.9 g/dL (ref 30.0–36.0)
MCV: 91 fL (ref 80.0–100.0)
Monocytes Absolute: 0.5 10*3/uL (ref 0.1–1.0)
Monocytes Relative: 9 %
Neutro Abs: 3.9 10*3/uL (ref 1.7–7.7)
Neutrophils Relative %: 70 %
Platelets: 196 10*3/uL (ref 150–400)
RBC: 4.09 MIL/uL (ref 3.87–5.11)
RDW: 12.5 % (ref 11.5–15.5)
WBC: 5.5 10*3/uL (ref 4.0–10.5)
nRBC: 0 % (ref 0.0–0.2)

## 2023-08-21 LAB — MAGNESIUM: Magnesium: 2.1 mg/dL (ref 1.7–2.4)

## 2023-08-21 MED ORDER — METHYLPREDNISOLONE SODIUM SUCC 125 MG IJ SOLR
125.0000 mg | Freq: Once | INTRAMUSCULAR | Status: AC
  Administered 2023-08-21: 125 mg via INTRAVENOUS
  Filled 2023-08-21: qty 2

## 2023-08-21 MED ORDER — POTASSIUM CHLORIDE CRYS ER 20 MEQ PO TBCR
40.0000 meq | EXTENDED_RELEASE_TABLET | Freq: Once | ORAL | Status: AC
  Administered 2023-08-21: 40 meq via ORAL
  Filled 2023-08-21: qty 2

## 2023-08-21 MED ORDER — DEXTROMETHORPHAN POLISTIREX ER 30 MG/5ML PO SUER
15.0000 mg | Freq: Once | ORAL | Status: AC
  Administered 2023-08-21: 15 mg via ORAL
  Filled 2023-08-21: qty 5

## 2023-08-21 MED ORDER — SODIUM CHLORIDE 0.9 % IV BOLUS
1000.0000 mL | Freq: Once | INTRAVENOUS | Status: AC
  Administered 2023-08-21: 1000 mL via INTRAVENOUS

## 2023-08-21 NOTE — ED Provider Notes (Signed)
 [facility] Provider Note    Event Date/Time   First MD Initiated Contact with Patient 08/21/23 2258     (approximate)   History   Shortness of Breath and Cough   HPI Annette Rojas is a 58 y.o. female with history of HTN presenting today for shortness of breath and cough.  Patient states having cough and congestion along with shortness of breath over the past 3 weeks.  Reports she got sick while on trip to [place].  Approximately 10 days ago she was started on a Z-Pak which had some improvement in symptoms but it recurred again after finishing it.  Has had coughing fits to the point of feeling like she is going to pass out.  Denies any specific overwhelming chest pain, nausea, vomiting, abdominal pain, leg pain, leg swelling.  No pressure or blood clots.  No history of asthma or smoking history.     Physical Exam   Triage Vital Signs: ED Triage Vitals  Encounter Vitals Group     BP 08/21/23 2019 (!) 140/77     Girls Systolic BP Percentile --      Girls Diastolic BP Percentile --      Boys Systolic BP Percentile --      Boys Diastolic BP Percentile --      Pulse Rate 08/21/23 2019 (!) 120     Resp 08/21/23 2019 18     Temp 08/21/23 2019 100 F (37.8 C)     Temp src --      SpO2 08/21/23 2019 99 %     Weight 08/21/23 2017 182 lb (82.6 kg)     Height 08/21/23 2017 5' 8 (1.727 m)     Head Circumference --      Peak Flow --      Pain Score 08/21/23 2017 8     Pain Loc --      Pain Education --      Exclude from Growth Chart --     Most recent vital signs: Vitals:   08/21/23 2257 08/21/23 2330  BP:    Pulse:    Resp: 17 13  Temp:    SpO2:     Physical Exam: I have reviewed the vital signs and nursing notes. General: Awake, alert, no acute distress.  Nontoxic appearing. Head:  Atraumatic, normocephalic.   ENT:  EOM intact, PERRL. Oral mucosa is pink and moist with no lesions. Neck: Neck is supple with full range of motion, No  meningeal signs. Cardiovascular:  tachycardia, RR, No murmurs. Peripheral pulses palpable and equal bilaterally. Respiratory:  Symmetrical chest wall expansion.  No rhonchi, rales, or wheezes.  Good air movement throughout.  No use of accessory muscles.   Musculoskeletal:  No cyanosis or edema. Moving extremities with full ROM Abdomen:  Soft, nontender, nondistended. Neuro:  GCS 15, moving all four extremities, interacting appropriately. Speech clear. Psych:  Calm, appropriate.   Skin:  Warm, dry, no rash.    ED Results / Procedures / Treatments   Labs (all labs ordered are listed, but only abnormal results are displayed) Labs Reviewed  BASIC METABOLIC PANEL WITH GFR - Abnormal; Notable for the following components:      Result Value   Potassium 2.9 (*)    Glucose, Bld 172 (*)    All other components within normal limits  RESP PANEL BY RT-PCR (RSV, FLU A&B, COVID)  RVPGX2  CBC WITH DIFFERENTIAL/PLATELET  MAGNESIUM     EKG My EKG interpretation:  Rate of 127, sinus tachycardia, normal axis, normal intervals.  No acute ST elevations or depressions   RADIOLOGY Independently interpreted chest x-ray and CTA chest with no evidence of pneumonia or PE   PROCEDURES:  Critical Care performed: No  Procedures   MEDICATIONS ORDERED IN ED: Medications  dextromethorphan (DELSYM) 30 MG/5ML liquid 15 mg (has no administration in time range)  sodium chloride  0.9 % bolus 1,000 mL (1,000 mLs Intravenous New Bag/Given 08/21/23 2356)  methylPREDNISolone  sodium succinate (SOLU-MEDROL ) 125 mg/2 mL injection 125 mg (125 mg Intravenous Given 08/21/23 2354)  dextromethorphan (DELSYM) 30 MG/5ML liquid 15 mg (15 mg Oral Given 08/21/23 2354)  potassium chloride SA (KLOR-CON M) CR tablet 40 mEq (40 mEq Oral Given 08/21/23 2353)  iohexol (OMNIPAQUE) 350 MG/ML injection 75 mL (75 mLs Intravenous Contrast Given 08/22/23 0010)     IMPRESSION / MDM / ASSESSMENT AND PLAN / ED COURSE  I reviewed the triage  vital signs and the nursing notes.                              Differential diagnosis includes, but is not limited to, pneumonia, COVID, flu, RSV, bronchitis, PE  Patient's presentation is most consistent with acute complicated illness / injury requiring diagnostic workup.  Patient is a 58 year old female presenting today for 3 weeks of cough and shortness of breath.  Previously been on a Z-Pak but still having ongoing symptoms.  No obvious wheezing or other abnormality heard on auscultation of the lungs.  No hypoxia or tachypnea on room air.  EKG other than tachycardia reassuring.  CBC with no leukocytosis.  BMP shows mild hypokalemia for which she was given oral repletion but otherwise unremarkable.  Negative for COVID, flu, RSV.  Magnesium level normal.  Initial chest x-ray does not show any obvious pneumonia.  However, given ongoing symptoms along with persistent tachycardia, will get CTA of the chest to rule out occult pneumonia versus PE given recent long flight.  In the meantime, patient was given 1 L fluids, Delsym cough medicine as well as 1 dose of Solu-Medrol .  CTA chest shows no evidence of PE or occult pneumonia.  Patient was reassessed and feeling significantly better with minimal to no cough at this time.  Suspect likely bronchitis given chronicity of symptoms and will discharge with Delsym cough syrup as well as prednisone  for 4 additional days.  She has follow-up with her PCP tomorrow and was given strict return precautions.  The patient is on the cardiac monitor to evaluate for evidence of arrhythmia and/or significant heart rate changes. Clinical Course as of 08/22/23 0045  Tue Aug 22, 2023  0043 Reassessed and feeling significantly better at this time.  Will discharge with steroids and cough medicine for suspected bronchitis. [DW]    Clinical Course User Index [DW] Malvina Alm DASEN, MD     FINAL CLINICAL IMPRESSION(S) / ED DIAGNOSES   Final diagnoses:  Bronchitis     Rx  / DC Orders   ED Discharge Orders          Ordered    predniSONE  (DELTASONE ) 10 MG tablet  Daily        08/22/23 0044    dextromethorphan (DELSYM) 30 MG/5ML liquid  As needed        08/22/23 0044             Note:  This document was prepared using Dragon voice recognition software and may include unintentional  dictation errors.   Malvina Alm DASEN, MD 08/22/23 [id number]

## 2023-08-21 NOTE — ED Triage Notes (Signed)
 Pt reports she has had cough and shortness of breath since returning from [place], 3 weeks ago. Pt reports she recently was on abx and her cough got better but has since worsened. Pt reports SOB when she is having coughing spells.

## 2023-08-22 ENCOUNTER — Other Ambulatory Visit

## 2023-08-22 ENCOUNTER — Encounter: Payer: Self-pay | Admitting: Family Medicine

## 2023-08-22 ENCOUNTER — Emergency Department

## 2023-08-22 ENCOUNTER — Ambulatory Visit (INDEPENDENT_AMBULATORY_CARE_PROVIDER_SITE_OTHER): Admitting: Family Medicine

## 2023-08-22 VITALS — BP 142/83 | HR 79 | Temp 98.4°F | Resp 18 | Ht 68.0 in | Wt 183.0 lb

## 2023-08-22 DIAGNOSIS — Z8669 Personal history of other diseases of the nervous system and sense organs: Secondary | ICD-10-CM | POA: Diagnosis not present

## 2023-08-22 DIAGNOSIS — R0602 Shortness of breath: Secondary | ICD-10-CM | POA: Diagnosis not present

## 2023-08-22 DIAGNOSIS — R059 Cough, unspecified: Secondary | ICD-10-CM | POA: Diagnosis not present

## 2023-08-22 DIAGNOSIS — R5383 Other fatigue: Secondary | ICD-10-CM | POA: Diagnosis not present

## 2023-08-22 DIAGNOSIS — K449 Diaphragmatic hernia without obstruction or gangrene: Secondary | ICD-10-CM | POA: Diagnosis not present

## 2023-08-22 DIAGNOSIS — R002 Palpitations: Secondary | ICD-10-CM | POA: Insufficient documentation

## 2023-08-22 DIAGNOSIS — R000 Tachycardia, unspecified: Secondary | ICD-10-CM | POA: Diagnosis not present

## 2023-08-22 LAB — POCT GLYCOSYLATED HEMOGLOBIN (HGB A1C): Hemoglobin A1C: 5.6 % (ref 4.0–5.6)

## 2023-08-22 MED ORDER — DEXTROMETHORPHAN POLISTIREX ER 30 MG/5ML PO SUER: 30.0000 mg | ORAL | 0 refills | Status: AC | PRN

## 2023-08-22 MED ORDER — DEXTROMETHORPHAN POLISTIREX ER 30 MG/5ML PO SUER
15.0000 mg | Freq: Once | ORAL | Status: AC
  Administered 2023-08-22: 15 mg via ORAL
  Filled 2023-08-22: qty 5

## 2023-08-22 MED ORDER — PREDNISONE 10 MG PO TABS: 40.0000 mg | ORAL_TABLET | Freq: Every day | ORAL | 0 refills | Status: AC

## 2023-08-22 MED ORDER — IOHEXOL 350 MG/ML SOLN
75.0000 mL | Freq: Once | INTRAVENOUS | Status: AC | PRN
Start: 2023-08-22 — End: 2023-08-22
  Administered 2023-08-22: 75 mL via INTRAVENOUS

## 2023-08-22 NOTE — Assessment & Plan Note (Signed)
 She takes atenolol 25 mg and this greatly controls her migraines.  If she needs something else usually an Excedrin will work caffeine or Fioricet.

## 2023-08-22 NOTE — Assessment & Plan Note (Signed)
 08/21/2022 chest x-ray negative CTA negative EKG normal.  WBC 5.5, hemoglobin 12.6, platelets 196 potassium was 2.9.  BUN 7 and creatinine 0.85.  Random blood sugar 174.  A1c today 5.6%

## 2023-08-22 NOTE — Assessment & Plan Note (Signed)
 Takes atenolol 25 mg daily and she no longer has palpitations.

## 2023-08-22 NOTE — Discharge Instructions (Signed)
 I have sent medication to your pharmacy to continue open with your cough.  Please follow-up with your primary care provider as needed.  CT imaging shows no obvious blood clot or pneumonia.

## 2023-08-22 NOTE — Progress Notes (Signed)
 New Patient Office Visit  Subjective    Patient ID: Annette Rojas, female    DOB: 17-Jul-1965  Age: 58 y.o. MRN: [id number]  CC:  Chief Complaint  Patient presents with   Establish Care   Bronchitis    HPI Annette Rojas presents to establish care.  Delightful 58 year old menopausal woman with palpitations (on atenolol), migraine headaches (atenolol), bursitis of her left hip, mixed hyperlipidemia, insomnia (melatonin 10 mg and 50 mg of Benadryl) colonoscopy 2022 (1 TA polyp, follow-up in 5 years), DDD lumbar spine,  s/p lumbar fusion 09/28/2022, s/p right breast fibroadenoma and left breast intraductal papilloma  breast biopsies 2024, postmenopausal bleeding and endometrial polyp removed (03/21/2023, GYN Demetra Schermerhorn), basal cell carcinoma of the nose. She is a stay-at-home mom and has a special needs 55 year old son who lives at home.  He has a [id number] genetic mutism.  He has autism, intellectual delay and episodes of lactic acidosis.      She had a lumbar fusion 05/19/2022 and reports that her back is great now.  She seldom has pain at all.      She had a heart fluttering sensation and was started on atenolol in 2019 and has had no problems since then.      She has insomnia and takes melatonin 10 mg and 50 mg of Benadryl at night.      She is complaining of left hip pain she has been to [facility] and took meloxicam for a month but it did not seem to help.  She got 1 cortisone shot in the hip and it did help but it did last.  She is on prednisone  now for chronic cough and her hip is much better.      She has problems with anxiety and depression her PHQ-9 score is 7 and her GAD-7 score is 6.  She has had depression off and on most of her adult life and at times she has felt suicidal but she is not there now.  She does admit that she is doing some stress eating.  She has anxiety and depression and takes Prozac 20 mg daily and alprazolam 0.5 mg at 30 will last 2 to 3 months.       She reports that she is very very tired and cannot understand why she is so tired.  She gets adequate sleep and can still take a nap during the day.  One day she slept till 1:00 in the afternoon.  She does not think that her depression is making her tired.      She had a significant cough and went to the ED yesterday.  She has had this cough since at least the beginning of May.  She went to urgent care 07/24/2023 and was negative for COVID and negative for flu A and B.  On 6/12 she saw her GP and got a Z-Pak that did not seem to help.  She developed coughing spells that are so significant she could not catch her breath.  So she went to the ED yesterday.  Her EKG was normal she had a chest x-ray that was benign and a CTA that was benign.  She was given 40 mg of prednisone  daily and Delsym.  She notes since taking the prednisone  her hip bursitis is improved.      She also complains of having joint pain that hurts all over.        She has hyperlipidemia and is on rosuvastatin 20 mg daily.  She is a never smoker.  She drinks 1-2 times a month and usually drinks 1 or 2 mixed drinks.  She does no illicit drugs.     Outpatient Encounter Medications as of 08/22/2023  Medication Sig   ALPRAZolam (XANAX) 0.25 MG tablet Take 0.25 mg by mouth as needed for anxiety.   atenolol (TENORMIN) 25 MG tablet Take 25 mg by mouth at bedtime.   dextromethorphan (DELSYM) 30 MG/5ML liquid Take 5 mLs (30 mg total) by mouth as needed for cough.   diphenhydrAMINE HCl (BENADRYL PO) Take 1 tablet by mouth daily as needed.   FLUoxetine (PROZAC) 20 MG capsule Take 20 mg by mouth daily.   ibuprofen (ADVIL) 600 MG tablet Take 600 mg by mouth every 6 (six) hours as needed.   MELATONIN PO Take 1 tablet by mouth at bedtime.   [START ON 08/23/2023] predniSONE  (DELTASONE ) 10 MG tablet Take 4 tablets (40 mg total) by mouth daily for 4 days.   rosuvastatin (CRESTOR) 20 MG tablet Take 20 mg by mouth at bedtime.   benzonatate  (TESSALON )  100 MG capsule Take 1 capsule (100 mg total) by mouth 3 (three) times daily as needed for cough. (Patient not taking: Reported on 08/22/2023)   [DISCONTINUED] baclofen  (LIORESAL ) 10 MG tablet Take 1 tablet (10 mg total) by mouth every 8 (eight) hours as needed for muscle spasms (Pain).   [DISCONTINUED] promethazine -dextromethorphan (PROMETHAZINE -DM) 6.25-15 MG/5ML syrup Take 5 mLs by mouth 4 (four) times daily as needed for cough.   [DISCONTINUED] traZODone (DESYREL) 50 MG tablet Take 50 mg by mouth at bedtime.   No facility-administered encounter medications on file as of 08/22/2023.    Past Medical History:  Diagnosis Date   Anxiety    on meds   Arthritis    lower back and sciatica   Depression    on meds   GERD (gastroesophageal reflux disease)    Hyperlipidemia    on meds   Hypertension    migraines/HTN   Migraines     Past Surgical History:  Procedure Laterality Date   BREAST BIOPSY Right 01/13/2021   us  bx, heart marker, path pending   BREAST BIOPSY  06/08/2022   MM RT RADIOACTIVE SEED LOC MAMMO GUIDE 06/08/2022 [facility]   BREAST BIOPSY  06/08/2022   MM LT RADIOACTIVE SEED LOC MAMMO GUIDE 06/08/2022 [facility]   BREAST EXCISIONAL BIOPSY Right 05/2022   fibroadenoma   BREAST EXCISIONAL BIOPSY Left 05/2022   papilloma   BREAST LUMPECTOMY WITH RADIOACTIVE SEED LOCALIZATION Bilateral 06/09/2022   Procedure: BILATERAL BREAST LUMPECTOMY WITH RADIOACTIVE SEED LOCALIZATION;  Surgeon: Belinda Cough, MD;  Location: [facility];  Service: General;  Laterality: Bilateral;   COLONOSCOPY     SPINE SURGERY      Family History  Problem Relation Age of Onset   Colon polyps Mother 70   Colon cancer Mother 35   Anxiety disorder Mother    Arthritis Mother    Cancer Mother    Diabetes Mother    Colon cancer Maternal Aunt    Colon cancer Maternal Uncle    Breast cancer Paternal Aunt 36   Breast cancer Paternal Aunt 34   Breast cancer Cousin 50        maternal side   Cancer Father    Cancer Brother    Diabetes Brother    Kidney disease Brother    Cancer Maternal Aunt    Intellectual disability Son    Learning disabilities Son  Esophageal cancer Neg Hx    Stomach cancer Neg Hx    Rectal cancer Neg Hx     Social History   Socioeconomic History   Marital status: Married    Spouse name: Not on file   Number of children: Not on file   Years of education: Not on file   Highest education level: Associate degree: academic program  Occupational History   Not on file  Tobacco Use   Smoking status: Never    Passive exposure: Never   Smokeless tobacco: Never  Vaping Use   Vaping status: Never Used  Substance and Sexual Activity   Alcohol use: Yes    Alcohol/week: 1.0 standard drink of alcohol    Types: 1 Standard drinks or equivalent per week    Comment: 3-4 drinks a month   Drug use: Never   Sexual activity: Yes    Birth control/protection: Other-see comments    Comment: Husband had vasectomy  Other Topics Concern   Not on file  Social History Narrative   Not on file   Social Drivers of Health   Financial Resource Strain: Low Risk  (08/17/2023)   Overall Financial Resource Strain (CARDIA)    Difficulty of Paying Living Expenses: Not very hard  Food Insecurity: No Food Insecurity (08/17/2023)   Hunger Vital Sign    Worried About Running Out of Food in the Last Year: Never true    Ran Out of Food in the Last Year: Never true  Transportation Needs: No Transportation Needs (08/17/2023)   PRAPARE - Administrator, Civil Service (Medical): No    Lack of Transportation (Non-Medical): No  Physical Activity: Insufficiently Active (08/17/2023)   Exercise Vital Sign    Days of Exercise per Week: 2 days    Minutes of Exercise per Session: 20 min  Stress: Stress Concern Present (08/17/2023)   [employer] of Occupational Health - Occupational Stress Questionnaire    Feeling of Stress: To some extent  Social  Connections: Moderately Integrated (08/17/2023)   Social Connection and Isolation Panel    Frequency of Communication with Friends and Family: More than three times a week    Frequency of Social Gatherings with Friends and Family: Once a week    Attends Religious Services: 1 to 4 times per year    Active Member of [employer] or Organizations: No    Attends Engineer, structural: Not on file    Marital Status: Married  Catering manager Violence: Not on file    ROS      Objective   BP (!) 142/83 (BP Location: Left Arm, Patient Position: Sitting, Cuff Size: Normal)   Pulse 79   Temp 98.4 F (36.9 C) (Oral)   Resp 18   Ht 5' 8 (1.727 m)   Wt 183 lb (83 kg)   LMP 08/23/2015   SpO2 94%   BMI 27.83 kg/m    Physical Exam Vitals and nursing note reviewed.  Constitutional:      Appearance: Normal appearance.  HENT:     Head: Normocephalic and atraumatic.   Eyes:     Conjunctiva/sclera: Conjunctivae normal.    Cardiovascular:     Rate and Rhythm: Normal rate and regular rhythm.  Pulmonary:     Effort: Pulmonary effort is normal.     Breath sounds: Normal breath sounds.   Musculoskeletal:     Right lower leg: No edema.     Left lower leg: No edema.   Skin:  General: Skin is warm and dry.   Neurological:     Mental Status: She is alert and oriented to person, place, and time.   Psychiatric:        Mood and Affect: Mood normal.        Behavior: Behavior normal.        Thought Content: Thought content normal.        Judgment: Judgment normal.            The ASCVD Risk score (Arnett DK, et al., 2019) failed to calculate for the following reasons:   Cannot find a previous HDL lab   Cannot find a previous total cholesterol lab     Assessment & Plan:  Other fatigue Assessment & Plan: 08/21/2022 chest x-ray negative CTA negative EKG normal.  WBC 5.5, hemoglobin 12.6, platelets 196 potassium was 2.9.  BUN 7 and creatinine 0.85.  Random blood sugar 174.   A1c today 5.6%  Orders: -     TSH + free T4 -     Vitamin B12 -     POCT glycosylated hemoglobin (Hb A1C)  Palpitations Assessment & Plan: Takes atenolol 25 mg daily and she no longer has palpitations.   History of migraine headaches Assessment & Plan: She takes atenolol 25 mg and this greatly controls her migraines.  If she needs something else usually an Excedrin will work caffeine or Fioricet.     No follow-ups on file.   Taylan Marez K Merriel Zinger, MD

## 2023-08-23 ENCOUNTER — Ambulatory Visit: Payer: Self-pay | Admitting: Family Medicine

## 2023-08-23 LAB — VITAMIN B12: Vitamin B-12: 468 pg/mL (ref 232–1245)

## 2023-08-23 LAB — TSH+FREE T4
Free T4: 1.07 ng/dL (ref 0.82–1.77)
TSH: 0.501 u[IU]/mL (ref 0.450–4.500)

## 2023-08-23 NOTE — Addendum Note (Signed)
 Addended by: Fatumata Kashani K on: 08/23/2023 11:01 AM   Modules accepted: Level of Service

## 2023-08-23 NOTE — Addendum Note (Signed)
 Addended by: Gertrude Bucks K on: 08/23/2023 11:02 AM   Modules accepted: Level of Service

## 2023-09-04 DIAGNOSIS — H15109 Unspecified episcleritis, unspecified eye: Secondary | ICD-10-CM | POA: Diagnosis not present

## 2023-09-14 DIAGNOSIS — H5213 Myopia, bilateral: Secondary | ICD-10-CM | POA: Diagnosis not present

## 2023-09-14 DIAGNOSIS — H15109 Unspecified episcleritis, unspecified eye: Secondary | ICD-10-CM | POA: Diagnosis not present

## 2023-09-14 DIAGNOSIS — H524 Presbyopia: Secondary | ICD-10-CM | POA: Diagnosis not present

## 2023-10-04 DIAGNOSIS — M549 Dorsalgia, unspecified: Secondary | ICD-10-CM | POA: Diagnosis not present

## 2023-10-23 ENCOUNTER — Encounter: Payer: Self-pay | Admitting: Family Medicine

## 2023-10-25 ENCOUNTER — Ambulatory Visit (INDEPENDENT_AMBULATORY_CARE_PROVIDER_SITE_OTHER): Admitting: Family Medicine

## 2023-10-25 ENCOUNTER — Encounter: Payer: Self-pay | Admitting: Family Medicine

## 2023-10-25 VITALS — BP 126/78 | HR 74 | Temp 98.0°F | Ht 68.0 in | Wt 186.2 lb

## 2023-10-25 DIAGNOSIS — M7061 Trochanteric bursitis, right hip: Secondary | ICD-10-CM | POA: Diagnosis not present

## 2023-10-25 DIAGNOSIS — R519 Headache, unspecified: Secondary | ICD-10-CM

## 2023-10-25 DIAGNOSIS — F339 Major depressive disorder, recurrent, unspecified: Secondary | ICD-10-CM | POA: Diagnosis not present

## 2023-10-25 MED ORDER — FLUOXETINE HCL 40 MG PO CAPS: 40.0000 mg | ORAL_CAPSULE | Freq: Every day | ORAL | 3 refills | Status: AC

## 2023-10-25 MED ORDER — BUTALBITAL-APAP-CAFFEINE 50-325-40 MG PO TABS: 1.0000 | ORAL_TABLET | Freq: Four times a day (QID) | ORAL | 1 refills | Status: AC | PRN

## 2023-10-25 NOTE — Progress Notes (Unsigned)
   Established Patient Office Visit  Subjective   Patient ID: Annette Rojas, female    DOB: Jan 31, 1966  Age: 58 y.o. MRN: [id number]  Chief Complaint  Patient presents with   Hip Pain    Ortho in April 27, given injection, right side scale about 7, throbbing and burning pain, keeps up at night    HPI   {History (Optional):23778}  ROS    Objective:     BP 126/78   Pulse 74   Temp 98 F (36.7 C) (Oral)   Ht 5' 8 (1.727 m)   Wt 186 lb 4 oz (84.5 kg)   LMP 08/23/2015   SpO2 96%   BMI 28.32 kg/m  {Vitals History (Optional):23777}  Physical Exam Vitals and nursing note reviewed.  Constitutional:      Appearance: Normal appearance.  HENT:     Head: Normocephalic and atraumatic.  Eyes:     Conjunctiva/sclera: Conjunctivae normal.  Cardiovascular:     Rate and Rhythm: Normal rate and regular rhythm.  Pulmonary:     Effort: Pulmonary effort is normal.     Breath sounds: Normal breath sounds.  Musculoskeletal:     Right lower leg: No edema.     Left lower leg: No edema.  Skin:    General: Skin is warm and dry.  Neurological:     Mental Status: She is alert and oriented to person, place, and time.  Psychiatric:        Mood and Affect: Mood normal.        Behavior: Behavior normal.        Thought Content: Thought content normal.        Judgment: Judgment normal.      {Perform Simple Foot Exam  Perform Detailed exam:1} {Insert foot Exam (Optional):30965}   No results found for any visits on 10/25/23.  {Labs (Optional):23779}  The ASCVD Risk score (Arnett DK, et al., 2019) failed to calculate for the following reasons:   Cannot find a previous HDL lab   Cannot find a previous total cholesterol lab    Assessment & Plan:  There are no diagnoses linked to this encounter.   Return in about 4 weeks (around 11/22/2023).    Ravon Mortellaro K Tracie Dore, MD

## 2023-10-26 DIAGNOSIS — M7061 Trochanteric bursitis, right hip: Secondary | ICD-10-CM | POA: Diagnosis not present

## 2023-10-26 NOTE — Assessment & Plan Note (Signed)
 This is a hard time of year for her because it is the anniversary time of losing both parents and her brother.  She is on fluoxetine  20 mg daily but does not feel it is working well enough.  She denies HI and SI.  Discussed increasing to Prozac  40 mg daily and to follow-up in a month.

## 2023-10-26 NOTE — Assessment & Plan Note (Signed)
 Encouraging her to follow-up with [facility] and received another shot if they offer it.  Also talked to her about Boswellia, an herbal remedy for inflammation.

## 2023-11-21 ENCOUNTER — Encounter: Payer: Self-pay | Admitting: Family Medicine

## 2023-11-21 ENCOUNTER — Ambulatory Visit (INDEPENDENT_AMBULATORY_CARE_PROVIDER_SITE_OTHER): Admitting: Family Medicine

## 2023-11-21 VITALS — BP 133/74 | HR 74 | Temp 98.2°F | Resp 18 | Ht 68.0 in | Wt 181.0 lb

## 2023-11-21 DIAGNOSIS — R1011 Right upper quadrant pain: Secondary | ICD-10-CM | POA: Diagnosis not present

## 2023-11-21 DIAGNOSIS — E782 Mixed hyperlipidemia: Secondary | ICD-10-CM | POA: Diagnosis not present

## 2023-11-21 DIAGNOSIS — R5383 Other fatigue: Secondary | ICD-10-CM

## 2023-11-21 DIAGNOSIS — F419 Anxiety disorder, unspecified: Secondary | ICD-10-CM

## 2023-11-21 NOTE — Assessment & Plan Note (Signed)
 She is on Crestor 20mg  daily.  Goal is ten year ASCVD risk of 7.5% or less.

## 2023-11-21 NOTE — Assessment & Plan Note (Signed)
 Checking an US  of the RUQ to rue out gall bladder disease

## 2023-11-21 NOTE — Progress Notes (Addendum)
 Established Patient Office Visit  Subjective   Patient ID: Annette Rojas, female    DOB: 12/08/1965  Age: 58 y.o. MRN: [id number]  Chief Complaint  Patient presents with   Medical Management of Chronic Issues   Abdominal Pain    URQ. Several months   Nausea    HPI  Discussed the use of AI scribe software for clinical note transcription with the patient, who gave verbal consent to proceed.  History of Present Illness   Annette Rojas is a 58 year old female who presents with worsening abdominal pain and migraines.  She has been experiencing abdominal pain for several months, which has recently worsened. The pain is a non-throbbing discomfort located on the right side, exacerbated by eating, and associated with a feeling of fullness. She has a history of gallbladder issues during pregnancy 25 years ago but did not undergo surgery. She has lost approximately 4-5 pounds recently and experiences nausea and cold sweats. No fever or tachycardia during these episodes.  She has a history of migraines, which have become more severe recently. During a particularly bad episode, she was bedridden until 4 PM, requiring three doses of Fioricet for relief. She also notes a new symptom of hearing her heartbeat when lying on one ear, which she did not experience before.  Her hip pain has improved significantly following a recent injection, allowing her to walk more without pain. The patient received an injection for her hip pain, which she reports was more effective than previous ones. However, she did not experience increased energy after the steroid injection.  She is currently on fluoxetine , which has helped with her depression and anxiety. She mentions increased anxiety due to family stress, particularly concerning her brother's health issues.  Family history is significant for liver disease; her brother is currently hospitalized with liver failure and cirrhosis, and her older brother died  from cirrhosis at 13. Both brothers were beer drinkers, and she expresses concern about her own liver health despite not being a drinker.    Annette Rojas is a 58 year old female who presents with worsening abdominal pain and migraines.  She has been experiencing abdominal pain for several months, which has recently worsened. The pain is a non-throbbing discomfort located on the right side, exacerbated by eating, and associated with a feeling of fullness. She has a history of gallbladder issues during pregnancy 25 years ago but did not undergo surgery. She has lost approximately 4-5 pounds recently and experiences nausea and cold sweats. No fever or tachycardia during these episodes.  She has a history of migraines, which have become more severe recently. During a particularly bad episode, she was bedridden until 4 PM, requiring three doses of Fioricet for relief. She also notes a new symptom of hearing her heartbeat when lying on one ear, which she did not experience before.  Her hip pain has improved significantly following a recent injection, allowing her to walk more without pain. The patient received an injection for her hip pain, which she reports was more effective than previous ones. However, she did not experience increased energy after the steroid injection.  She is currently on fluoxetine , which has helped with her depression and anxiety. She mentions increased anxiety due to family stress, particularly concerning her brother's health issues.  Family history is significant for liver disease; her brother is currently hospitalized with liver failure and cirrhosis, and her older brother died from cirrhosis at 16. Both brothers were beer drinkers, and  she expresses concern about her own liver health despite not being a drinker.         Objective:     BP 133/74 (BP Location: Left Arm, Patient Position: Sitting, Cuff Size: Normal)   Pulse 74   Temp 98.2 F (36.8 C) (Oral)   Resp 18    Ht 5' 8 (1.727 m)   Wt 181 lb (82.1 kg)   LMP 08/23/2015   SpO2 96%   BMI 27.52 kg/m    Physical Exam Vitals and nursing note reviewed.  Constitutional:      Appearance: Normal appearance.  HENT:     Head: Normocephalic and atraumatic.  Eyes:     Conjunctiva/sclera: Conjunctivae normal.  Cardiovascular:     Rate and Rhythm: Normal rate and regular rhythm.  Pulmonary:     Effort: Pulmonary effort is normal.     Breath sounds: Normal breath sounds.  Abdominal:     General: Abdomen is flat.     Palpations: Abdomen is soft.     Tenderness: There is no abdominal tenderness.  Musculoskeletal:     Right lower leg: No edema.     Left lower leg: No edema.  Skin:    General: Skin is warm and dry.  Neurological:     Mental Status: She is alert and oriented to person, place, and time.  Psychiatric:        Mood and Affect: Mood normal.        Behavior: Behavior normal.        Thought Content: Thought content normal.        Judgment: Judgment normal.          No results found for any visits on 11/21/23.    The ASCVD Risk score (Arnett DK, et al., 2019) failed to calculate for the following reasons:   Cannot find a previous HDL lab   Cannot find a previous total cholesterol lab    Assessment & Plan:  Right upper quadrant pain Assessment & Plan: Checking an US  of the RUQ to rue out gall bladder disease  Orders: -     US  ABDOMEN LIMITED RUQ (LIVER/GB); Future -     CBC with Differential/Platelet -     Comprehensive metabolic panel with GFR  Mixed hyperlipidemia Assessment & Plan: She is on Crestor 20mg  daily.  Goal is ten year ASCVD risk of 7.5% or less.  Orders: -     Lipid panel  Other fatigue -     TSH -     T4, free -     Sedimentation rate     Return in about 3 months (around 02/20/2024).    Alfred Harrel K Satoya Feeley, MD

## 2023-11-22 LAB — COMPREHENSIVE METABOLIC PANEL WITH GFR
ALT: 15 IU/L (ref 0–32)
AST: 16 IU/L (ref 0–40)
Albumin: 4.6 g/dL (ref 3.8–4.9)
Alkaline Phosphatase: 81 IU/L (ref 49–135)
BUN/Creatinine Ratio: 17 (ref 9–23)
BUN: 14 mg/dL (ref 6–24)
Bilirubin Total: 0.2 mg/dL (ref 0.0–1.2)
CO2: 22 mmol/L (ref 20–29)
Calcium: 9.2 mg/dL (ref 8.7–10.2)
Chloride: 103 mmol/L (ref 96–106)
Creatinine, Ser: 0.83 mg/dL (ref 0.57–1.00)
Globulin, Total: 2 g/dL (ref 1.5–4.5)
Glucose: 97 mg/dL (ref 70–99)
Potassium: 4.4 mmol/L (ref 3.5–5.2)
Sodium: 140 mmol/L (ref 134–144)
Total Protein: 6.6 g/dL (ref 6.0–8.5)
eGFR: 81 mL/min/1.73 (ref >59)

## 2023-11-22 LAB — CBC WITH DIFFERENTIAL/PLATELET
Basophils Absolute: 0 x10E3/uL (ref 0.0–0.2)
Basos: 0 %
EOS (ABSOLUTE): 0.1 x10E3/uL (ref 0.0–0.4)
Eos: 2 %
Hematocrit: 42.1 % (ref 34.0–46.6)
Hemoglobin: 13.4 g/dL (ref 11.1–15.9)
Immature Grans (Abs): 0 x10E3/uL (ref 0.0–0.1)
Immature Granulocytes: 0 %
Lymphocytes Absolute: 1.4 x10E3/uL (ref 0.7–3.1)
Lymphs: 32 %
MCH: 30 pg (ref 26.6–33.0)
MCHC: 31.8 g/dL (ref 31.5–35.7)
MCV: 94 fL (ref 79–97)
Monocytes Absolute: 0.4 x10E3/uL (ref 0.1–0.9)
Monocytes: 8 %
Neutrophils Absolute: 2.6 x10E3/uL (ref 1.4–7.0)
Neutrophils: 58 %
Platelets: 284 x10E3/uL (ref 150–450)
RBC: 4.47 x10E6/uL (ref 3.77–5.28)
RDW: 13.1 % (ref 11.7–15.4)
WBC: 4.5 x10E3/uL (ref 3.4–10.8)

## 2023-11-22 LAB — LIPID PANEL
Chol/HDL Ratio: 4.6 ratio — ABNORMAL HIGH (ref 0.0–4.4)
Cholesterol, Total: 257 mg/dL — ABNORMAL HIGH (ref 100–199)
HDL: 56 mg/dL (ref >39)
LDL Chol Calc (NIH): 177 mg/dL — ABNORMAL HIGH (ref 0–99)
Triglycerides: 133 mg/dL (ref 0–149)
VLDL Cholesterol Cal: 24 mg/dL (ref 5–40)

## 2023-11-22 LAB — TSH: TSH: 2.78 u[IU]/mL (ref 0.450–4.500)

## 2023-11-22 LAB — T4, FREE: Free T4: 1.37 ng/dL (ref 0.82–1.77)

## 2023-11-22 LAB — SEDIMENTATION RATE: Sed Rate: 25 mm/h (ref 0–40)

## 2023-11-23 ENCOUNTER — Ambulatory Visit
Admission: RE | Admit: 2023-11-23 | Discharge: 2023-11-23 | Disposition: A | Discharge status: Home / Self Care | Source: Ambulatory Visit | Attending: Family Medicine | Admitting: Family Medicine

## 2023-11-23 DIAGNOSIS — R1011 Right upper quadrant pain: Secondary | ICD-10-CM | POA: Insufficient documentation

## 2023-11-23 DIAGNOSIS — K8020 Calculus of gallbladder without cholecystitis without obstruction: Secondary | ICD-10-CM | POA: Diagnosis not present

## 2023-11-23 DIAGNOSIS — K7689 Other specified diseases of liver: Secondary | ICD-10-CM | POA: Diagnosis not present

## 2023-11-24 ENCOUNTER — Other Ambulatory Visit: Payer: Self-pay | Admitting: Family Medicine

## 2023-11-24 ENCOUNTER — Ambulatory Visit: Payer: Self-pay | Admitting: Family Medicine

## 2023-11-24 ENCOUNTER — Ambulatory Visit: Admitting: Family Medicine

## 2023-11-24 DIAGNOSIS — R1011 Right upper quadrant pain: Secondary | ICD-10-CM

## 2023-12-05 ENCOUNTER — Other Ambulatory Visit: Payer: Self-pay | Admitting: Family Medicine

## 2023-12-05 ENCOUNTER — Other Ambulatory Visit: Payer: Self-pay

## 2023-12-05 DIAGNOSIS — F419 Anxiety disorder, unspecified: Secondary | ICD-10-CM

## 2023-12-05 DIAGNOSIS — I10 Essential (primary) hypertension: Secondary | ICD-10-CM

## 2023-12-05 DIAGNOSIS — F329 Major depressive disorder, single episode, unspecified: Secondary | ICD-10-CM

## 2023-12-05 MED ORDER — ATENOLOL 25 MG PO TABS: 25.0000 mg | ORAL_TABLET | Freq: Every day | ORAL | 1 refills | Status: AC

## 2023-12-07 ENCOUNTER — Other Ambulatory Visit: Payer: Self-pay | Admitting: Family Medicine

## 2023-12-07 NOTE — Telephone Encounter (Unsigned)
 Copied from CRM [id number]. Topic: Clinical - Medication Refill >> Dec 07, 2023  2:27 PM Shanda MATSU wrote: Medication: ALPRAZolam (XANAX) 0.25 MG tablet  Has the patient contacted their pharmacy? Yes, adv patient to reach out to provider.  (Agent: If no, request that the patient contact the pharmacy for the refill. If patient does not wish to contact the pharmacy document the reason why and proceed with request.) (Agent: If yes, when and what did the pharmacy advise?)  This is the patient's preferred pharmacy:  [id number] - [place], [place] - [address] [place] [address] Phone: [phone] Fax: [phone]  Is this the correct pharmacy for this prescription? Yes If no, delete pharmacy and type the correct one.   Has the prescription been filled recently? No  Is the patient out of the medication? No  Has the patient been seen for an appointment in the last year OR does the patient have an upcoming appointment? Yes  Can we respond through MyChart? Yes  Agent: Please be advised that Rx refills may take up to 3 business days. We ask that you follow-up with your pharmacy.

## 2023-12-09 MED ORDER — ALPRAZOLAM 0.25 MG PO TABS: 0.2500 mg | ORAL_TABLET | Freq: Two times a day (BID) | ORAL | 1 refills | Status: DC | PRN

## 2023-12-09 NOTE — Addendum Note (Signed)
 Addended by: Jaimie Redditt K on: 12/09/2023 04:07 PM   Modules accepted: Orders

## 2023-12-12 ENCOUNTER — Ambulatory Visit
Admission: RE | Admit: 2023-12-12 | Discharge: 2023-12-12 | Disposition: A | Discharge status: Home / Self Care | Source: Ambulatory Visit | Attending: Family Medicine | Admitting: Family Medicine

## 2023-12-12 DIAGNOSIS — R1011 Right upper quadrant pain: Secondary | ICD-10-CM | POA: Insufficient documentation

## 2023-12-12 DIAGNOSIS — R109 Unspecified abdominal pain: Secondary | ICD-10-CM | POA: Diagnosis not present

## 2023-12-12 MED ORDER — TECHNETIUM TC 99M MEBROFENIN IV KIT
5.2100 | PACK | Freq: Once | INTRAVENOUS | Status: AC | PRN
  Administered 2023-12-12: 5.21 via INTRAVENOUS

## 2023-12-13 ENCOUNTER — Telehealth: Payer: Self-pay | Admitting: Family Medicine

## 2023-12-13 ENCOUNTER — Other Ambulatory Visit: Payer: Self-pay | Admitting: Family Medicine

## 2023-12-13 ENCOUNTER — Ambulatory Visit: Payer: Self-pay | Admitting: Family Medicine

## 2023-12-13 ENCOUNTER — Telehealth: Payer: Self-pay

## 2023-12-13 DIAGNOSIS — Q441 Other congenital malformations of gallbladder: Secondary | ICD-10-CM

## 2023-12-13 DIAGNOSIS — R1011 Right upper quadrant pain: Secondary | ICD-10-CM

## 2023-12-13 NOTE — Telephone Encounter (Signed)
 Copied from CRM [id number]. Topic: General - Other >> Dec 13, 2023  9:07 AM Joesph NOVAK wrote: Reason for CRM: Patients phone died while on the phone with Dr.Ziglar. She also wanted to let her know she wants to see Annette Rojas for her Gallbladder removal.

## 2023-12-13 NOTE — Telephone Encounter (Signed)
 Reviewed her HIDA scan results.  She has hyperkinetic function.  Will refer her to general surgery to decide if any her gallbladder needs to be removed.  If there is a Equities trader you want to see let me know and we can tailor your referral.

## 2023-12-13 NOTE — Progress Notes (Signed)
 Referral to general surgeon.  Hyperkinetic gallbladder.

## 2023-12-14 ENCOUNTER — Encounter: Payer: Self-pay | Admitting: Family Medicine

## 2023-12-20 ENCOUNTER — Other Ambulatory Visit: Payer: Self-pay | Admitting: Family Medicine

## 2023-12-20 DIAGNOSIS — Z1231 Encounter for screening mammogram for malignant neoplasm of breast: Secondary | ICD-10-CM

## 2023-12-29 ENCOUNTER — Ambulatory Visit: Payer: Self-pay | Admitting: Surgery

## 2023-12-29 DIAGNOSIS — K8010 Calculus of gallbladder with chronic cholecystitis without obstruction: Secondary | ICD-10-CM | POA: Diagnosis not present

## 2023-12-29 NOTE — H&P (Signed)
 Subjective   Chief Complaint: NEW PROBLEM (RUQ pain/ hyperkinetic gallbladder)     History of Present Illness: Annette Rojas is a 58 y.o. female who is seen today as an office consultation at the request of Dr. Ziglar for evaluation of NEW PROBLEM (RUQ pain/ hyperkinetic gallbladder) .    This is a 58 year old postmenopausal female who presents with a 3-year history of intermittent bilateral clear nipple discharge.  She also has had intermittent left breast tenderness.  She underwent a thorough work-up including mammograms, ultrasounds, MRI, and bilateral breast biopsies.  She denies any history of bloody nipple discharge.  No previous breast biopsies.  No previous surgeries.   In the right retroareolar breast at 8:00, there is a 6 x 3 x 3 mm mass that was biopsied as a fibroadenoma.  The left lower inner quadrant shows a 6 mm intraductal papilloma.  She had another biopsy in the left upper inner quadrant that was benign.   She has a family history of breast cancer in 2 different paternal aunts and a maternal first cousin.  Both of her parents died within the last couple of years from neuroendocrine tumors.   She was seen a year ago and we recommended bilateral radioactive seed localized lumpectomies.  These procedures were denied by insurance.  She has switched insurance companies.  Follow-up mammogram in December showed no significant changes.  There are no concerning findings for malignancy.  She presents now to discuss scheduling surgery.   On 06/09/2022, she underwent bilateral radioactive seed localized lumpectomies. Pathology confirmed a right breast fibroadenoma and a left breast that showed no residual intraductal papilloma. No sign of malignancy.   She now presents with several months of upper abdominal pain located mostly on her right side.  This is exacerbated by eating.  Her abdominal pain is associated with nausea and abdominal bloating.  Ultrasound showed cholelithiasis  and some hepatic steatosis.  The primary care physician ordered a HIDA scan that showed patent biliary tree and a gallbladder ejection fraction of 88%.  She denies any vomiting or diarrhea.  Review of Systems: A complete review of systems was obtained from the patient.  I have reviewed this information and discussed as appropriate with the patient.  See HPI as well for other ROS.  Review of Systems  Constitutional: Negative.   HENT: Negative.    Eyes: Negative.   Respiratory: Negative.    Cardiovascular: Negative.   Gastrointestinal:  Positive for abdominal pain and nausea.  Genitourinary:  Positive for flank pain.  Skin: Negative.   Neurological: Negative.   Endo/Heme/Allergies: Negative.   Psychiatric/Behavioral: Negative.        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    Lumbar degenerative disc disease 09/11/2022   Symptomatic lumbar degenerative disc disease posterior annular tears L4-5 L5-S1 with associated lumbar spinal stenosis/disc herniation L5-S1 left.    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    Major depressive disorder 09/05/2022   Migraines 09/05/2022   Spondylolisthesis of lumbar region  09/11/2022   Degenerative instability associated with structurally severe disc degeneration L5-S1.     Patient Active Problem List  Diagnosis   Bilateral nipple discharge   Fibroadenoma of right breast   Intraductal papilloma of breast, left   Pseudoclaudication syndrome   Essential hypertension   Major depressive  disorder   Migraines   Lumbar disc herniation with myelopathy   Lumbar degenerative disc disease   Left foot drop   Spondylolisthesis of lumbar region   Cervical polyp   Genitourinary syndrome of menopause   Axillary mass, bilateral   Postmenopausal bleeding    Past Surgical History:  Procedure Laterality Date   PERCUTANEOUS SPINAL FUSION Bilateral 09/28/2022   Procedure: BILATERAL LUMBAR DECOMPRESSION, STABILIZATION SPINAL FUSION L5-S1, EXCISION CENTRAL/LEFT DISC HERNIATION L5-S1, BILATERAL LUMBAR DECOMPRESSION L4-5. AUTOGRAFT/VIVIGEN;  Surgeon: Derian, Thomas Craig, MD;  Location: [facility] OR;  Service: Orthopedics;  Laterality: Bilateral;   INSTRUMENTATION NON-SEGMENTAL POSTERIOR SPINE N/A 09/28/2022   Procedure: INSTRUMENTATION POSTERIOR SPINE 1/2 VERTEBRAL SEGMENTS W/O FIXATION ADDITIONAL FIFTH;  Surgeon: Pascal Debby Banks, MD;  Location: [facility] OR;  Service: Orthopedics;  Laterality: N/A;   LAMINECTOMY POSTERIOR LUMBAR FACETECTOMY & FORAMINOTOMY W/DECOMP N/A 09/28/2022   Procedure: LAMINECTOMY, FACETECTOMY AND FORAMINOTOMY (UNILATERAL OR BILATERAL WITH DECOMPRESSION OF SPINAL CORD, CAUDA EQUINA AND/OR NERVE ROOT(S), SINGLE VERTEBRAL SEGMENT; LUMBAR;  Surgeon: Pascal Debby Banks, MD;  Location: [facility] OR;  Service: Orthopedics;  Laterality: N/A;   LAMINECTOMY POSTERIOR CERVICLE DECOMP W/FACETECTOMY & FORAMINOTOMY N/A 09/28/2022   Procedure: LAMINECTOMY, FACETECTOMY AND FORAMINOTOMY, SINGLE VERTEBRAL SEGMENT; EACH ADDITIONAL vertebral SEGMENT, CERVICAL, THORACIC, OR LUMBAR (LIST IN ADDITION TO PRIMARY PROCEDURE);  Surgeon: Pascal Debby Banks, MD;  Location: [facility] OR;  Service: Orthopedics;  Laterality: N/A;   BREAST BIOPSY     BREAST SURGERY     MASTECTOMY PARTIAL / LUMPECTOMY Bilateral    03/2022   SPINE SURGERY  7/24   L5/S1 fusion     No Known Allergies  Current Outpatient Medications on File Prior to Visit  Medication Sig Dispense Refill   ALPRAZolam (XANAX) 0.25 MG tablet Take  0.25 mg by mouth 2 (two) times daily as needed     atenoloL (TENORMIN) 25 MG tablet Take 25 mg by mouth every evening     COQ10, UBIQUINOL, ORAL Take 1 tablet by mouth at bedtime     diphenhydrAMINE (BENADRYL) 25 mg capsule Take 25 mg by mouth every 6 (six) hours as needed for Itching     FLUoxetine  (PROZAC ) 20 MG capsule Take 20 mg by mouth once daily     ibuprofen (MOTRIN) 400 MG tablet Take 400 mg by mouth every 6 (six) hours as needed for Pain     meclizine (ANTIVERT) 25 mg tablet      melatonin 10 mg Cap Take 10 mg by mouth at bedtime     omeprazole (PRILOSEC OTC) 20 MG EC tablet Take 20 mg by mouth at bedtime     rosuvastatin (CRESTOR) 20 MG tablet Take 20 mg by mouth every evening     No current facility-administered medications on file prior to visit.    Family History  Problem Relation Age of Onset   Diabetes Mother    Colon cancer Mother    Thyroid  disease Mother    Diabetes Brother    Breast cancer Maternal Aunt    Melanoma Maternal Aunt    Skin cancer Maternal Aunt      Social History   Tobacco Use  Smoking Status Never  Smokeless Tobacco Never  Social History   Socioeconomic History   Marital status: Married  Tobacco Use   Smoking status: Never   Smokeless tobacco: Never  Vaping Use   Vaping status: Never Used  Substance and Sexual Activity   Alcohol use: Yes    Alcohol/week: 2.0 standard drinks of alcohol    Types: 2 Standard drinks or equivalent per week   Drug use: Never   Sexual activity: Yes    Partners: Male    Birth control/protection: None  Other Topics Concern   Would you please tell us  about the people who live in your home, your pets, or anything else important to your social life? No   Social Drivers of Corporate Investment Banker Strain: Low Risk  (08/17/2023)   Received from [facility]   Overall Financial Resource Strain (CARDIA)    How hard is it for you to pay for the very basics like food, housing, medical care, and heating?:  Not very hard  Food Insecurity: No Food Insecurity (08/17/2023)   Received from [facility]   Hunger Vital Sign    Within the past 12 months, you worried that your food would run out before you got the money to buy more.: Never true    Within the past 12 months, the food you bought just didn't last and you didn't have money to get more.: Never true  Transportation Needs: No Transportation Needs (08/17/2023)   Received from [facility] - Transportation    In the past 12 months, has lack of transportation kept you from medical appointments or from getting medications?: No    In the past 12 months, has lack of transportation kept you from meetings, work, or from getting things needed for daily living?: No  Physical Activity: Insufficiently Active (08/17/2023)   Received from [facility]   Exercise Vital Sign    On average, how many days per week do you engage in moderate to strenuous exercise (like a brisk walk)?: 2 days    On average, how many minutes do you engage in exercise at this level?: 20 min  Stress: Stress Concern Present (08/17/2023)   Received from [facility] - Occupational Stress Questionnaire    Do you feel stress - tense, restless, nervous, or anxious, or unable to sleep at night because your mind is troubled all the time - these days?: To some extent  Social Connections: Moderately Integrated (08/17/2023)   Received from [facility]   Social Connection and Isolation Panel    In a typical week, how many times do you talk on the phone with family, friends, or neighbors?: More than three times a week    How often do you get together with friends or relatives?: Once a week    How often do you attend church or religious services?: 1 to 4 times per year    Do you belong to any clubs or organizations such as church groups, unions, fraternal or athletic groups, or school groups?: No    Are you married, widowed, divorced, separated,  never married, or living with a partner?: Married  Housing Stability: Low Risk  (03/16/2023)   Housing Stability Vital Sign    Unable to Pay for Housing in the Last Year: No    Number of Times Moved in the Last Year: 0    Homeless in the Last Year: No    Objective:    Vitals:   12/29/23 1020  Pulse: 89  Resp: 16  Temp: 36.6 C (97.9 F)  SpO2: 99%  Weight: 82.8 kg (182 lb 9.6 oz)  Height: 172.7 cm (5' 8)    Body mass index is 27.76 kg/m.  Physical Exam   Constitutional:  WDWN in NAD, conversant, no obvious deformities; lying in bed comfortably Eyes:  Pupils equal, round; sclera anicteric; moist conjunctiva; no lid lag HENT:  Oral mucosa moist; good dentition  Neck:  No masses palpated, trachea midline; no thyromegaly Lungs:  CTA bilaterally; normal respiratory effort CV:  Regular rate and rhythm; no murmurs; extremities well-perfused with no edema Abd:  +bowel sounds, soft, mildly tender right upper quadrant no palpable organomegaly; no palpable hernias Musc: Normal gait; no apparent clubbing or cyanosis in extremities Lymphatic:  No palpable cervical or axillary lymphadenopathy Skin:  Warm, dry; no sign of jaundice Psychiatric - alert and oriented x 4; calm mood and affect   Labs, Imaging and Diagnostic Testing: CLINICAL DATA:  RIGHT upper quadrant pain for 1 year   EXAM: ULTRASOUND ABDOMEN LIMITED RIGHT UPPER QUADRANT   COMPARISON:  None available   FINDINGS: Gallbladder:   Gallstones: 7 mm gallstone seen within the gallbladder lumen.   Sludge: Minimal   Gallbladder Wall: Within normal limits   Pericholecystic fluid: None   Sonographic Murphy's Sign: Negative per technologist   Common bile duct:   Diameter: 5 mm   Liver:   Parenchymal echogenicity: Diffusely echogenic   Contours: Normal   Lesions: None   Portal vein: Patent.  Hepatopetal flow   Other: None.   IMPRESSION: 1. Minimal cholelithiasis. 2. Diffuse increased echogenicity of the  hepatic parenchyma is a nonspecific indicator of hepatocellular dysfunction, most commonly steatosis.     Electronically Signed   By: Aliene Lloyd M.D.   On: 11/24/2023 09:11  CLINICAL DATA:  Abdominal pain, acute, nonlocalized.   EXAM: NUCLEAR MEDICINE HEPATOBILIARY IMAGING WITH GALLBLADDER EF   TECHNIQUE: Sequential images of the abdomen were obtained out to 60 minutes following intravenous administration of radiopharmaceutical. After oral ingestion of Ensure, gallbladder ejection fraction was determined. At 60 min, normal ejection fraction is greater than 33%.   RADIOPHARMACEUTICALS:  5.21 mCi Tc-30m  Choletec IV   COMPARISON:  Ultrasound November 23, 2023   FINDINGS: Prompt uptake and biliary excretion of activity by the liver is seen. Gallbladder activity is visualized, consistent with patency of cystic duct. Biliary activity passes into small bowel, consistent with patent common bile duct.   Calculated gallbladder ejection fraction is 88%. (Normal gallbladder ejection fraction with Ensure is greater than 33% and less than 80%.)   IMPRESSION: 1.  Patent cystic and common bile ducts.   2. Elevated gallbladder ejection fraction as can be seen with gallbladder hyperkinesia.     Electronically Signed   By: Reyes Holder M.D.   On: 12/12/2023 17:54  Assessment and Plan:  Diagnoses and all orders for this visit:  Calculus of gallbladder with chronic cholecystitis without obstruction    Recommend laparoscopic cholecystectomy with intraoperative cholangiogram.The surgical procedure has been discussed with the patient.  Potential risks, benefits, alternative treatments, and expected outcomes have been explained.  All of the patient's questions at this time have been answered.  The likelihood of reaching the patient's treatment goal is good.  The patient understands the proposed surgical procedure and wishes to proceed.   Tyauna Lacaze DEWAYNE LIMA, MD  12/29/2023 12:40  PM

## 2024-01-02 ENCOUNTER — Ambulatory Visit

## 2024-01-02 VITALS — BP 133/74 | Ht 68.0 in | Wt 181.0 lb

## 2024-01-02 DIAGNOSIS — Z0000 Encounter for general adult medical examination without abnormal findings: Secondary | ICD-10-CM

## 2024-01-02 NOTE — Patient Instructions (Addendum)
 Ms. Beagley,   Thank you for taking the time for your Medicare Wellness Visit. I appreciate your continued commitment to your health goals. Please review the care plan we discussed, and feel free to reach out if I can assist you further.  Please note that Annual Wellness Visits do not include a physical exam. Some assessments may be limited, especially if the visit was conducted virtually. If needed, we may recommend an in-person follow-up with your provider.  Ongoing Care Seeing your primary care provider every 3 to 6 months helps us  monitor your health and provide consistent, personalized care.   Referrals If a referral was made during today's visit and you haven't received any updates within two weeks, please contact the referred provider directly to check on the status.  Recommended Screenings:  Health Maintenance  Topic Date Due   HIV Screening  Never done   Hepatitis C Screening  Never done   DTaP/Tdap/Td vaccine (1 - Tdap) Never done   Hepatitis B Vaccine (1 of 3 - 19+ 3-dose series) Never done   Zoster (Shingles) Vaccine (1 of 2) Never done   Pneumococcal Vaccine for age over 81 (1 of 1 - PCV) Never done   COVID-19 Vaccine (3 - Pfizer risk series) 12/22/2019   Flu Shot  05/28/2024*   Pap with HPV screening  04/09/2024   Breast Cancer Screening  02/01/2025   Colon Cancer Screening  07/17/2026   HPV Vaccine  Aged Out   Meningitis B Vaccine  Aged Out  *Topic was postponed. The date shown is not the original due date.       01/02/2024    7:44 AM  Advanced Directives  Does Patient Have a Medical Advance Directive? No  Would patient like information on creating a medical advance directive? No - Patient declined    Vision: Annual vision screenings are recommended for early detection of glaucoma, cataracts, and diabetic retinopathy. These exams can also reveal signs of chronic conditions such as diabetes and high blood pressure.  Dental: Annual dental screenings help  detect early signs of oral cancer, gum disease, and other conditions linked to overall health, including heart disease and diabetes.  Please see the attached documents for additional preventive care recommendations.

## 2024-01-02 NOTE — Progress Notes (Unsigned)
 Because this visit was a virtual/telehealth visit,  certain criteria was not obtained, such a blood pressure, CBG if applicable, and timed get up and go. Any medications not marked as taking were not mentioned during the medication reconciliation part of the visit. Any vitals not documented were not able to be obtained due to this being a telehealth visit or patient was unable to self-report a recent blood pressure reading due to a lack of equipment at home via telehealth. Vitals that have been documented are verbally provided by the patient.  This visit was performed by a medical professional under my direct supervision. I was immediately available for consultation/collaboration. I have reviewed and agree with the Annual Wellness Visit documentation.  Subjective:   Annette Rojas is a 58 y.o. female who presents for a Medicare Annual Wellness Visit.  Allergies (verified) Patient has no known allergies.   History: Past Medical History:  Diagnosis Date   Anxiety    on meds   Arthritis    lower back and sciatica   Depression    on meds   GERD (gastroesophageal reflux disease)    Hyperlipidemia    on meds   Hypertension    migraines/HTN   Migraines    Past Surgical History:  Procedure Laterality Date   BREAST BIOPSY Right 01/13/2021   us  bx, heart marker, path pending   BREAST BIOPSY  06/08/2022   MM RT RADIOACTIVE SEED LOC MAMMO GUIDE 06/08/2022 [facility]   BREAST BIOPSY  06/08/2022   MM LT RADIOACTIVE SEED LOC MAMMO GUIDE 06/08/2022 [facility]   BREAST EXCISIONAL BIOPSY Right 05/2022   fibroadenoma   BREAST EXCISIONAL BIOPSY Left 05/2022   papilloma   BREAST LUMPECTOMY WITH RADIOACTIVE SEED LOCALIZATION Bilateral 06/09/2022   Procedure: BILATERAL BREAST LUMPECTOMY WITH RADIOACTIVE SEED LOCALIZATION;  Surgeon: Belinda Cough, MD;  Location: [facility];  Service: General;  Laterality: Bilateral;   COLONOSCOPY     SPINE SURGERY     Family  History  Problem Relation Age of Onset   Colon polyps Mother 18   Colon cancer Mother 17   Anxiety disorder Mother    Arthritis Mother    Cancer Mother    Diabetes Mother    Colon cancer Maternal Aunt    Colon cancer Maternal Uncle    Breast cancer Paternal Aunt 24   Breast cancer Paternal Aunt 25   Breast cancer Cousin 50       maternal side   Cancer Father    Cancer Brother    Diabetes Brother    Kidney disease Brother    Cancer Maternal Aunt    Intellectual disability Son    Learning disabilities Son    Esophageal cancer Neg Hx    Stomach cancer Neg Hx    Rectal cancer Neg Hx    Social History   Occupational History   Not on file  Tobacco Use   Smoking status: Never    Passive exposure: Never   Smokeless tobacco: Never  Vaping Use   Vaping status: Never Used  Substance and Sexual Activity   Alcohol use: Yes    Alcohol/week: 1.0 standard drink of alcohol    Types: 1 Standard drinks or equivalent per week    Comment: 3-4 drinks a month   Drug use: Never   Sexual activity: Yes    Birth control/protection: Other-see comments    Comment: Husband had vasectomy   Tobacco Counseling Counseling given: Not Answered  SDOH Screenings  Food Insecurity: No Food Insecurity (01/02/2024)  Housing: Low Risk  (01/02/2024)  Transportation Needs: No Transportation Needs (01/02/2024)  Utilities: Not At Risk (01/02/2024)  Alcohol Screen: Low Risk  (01/02/2024)  Depression (PHQ2-9): Low Risk  (01/02/2024)  Financial Resource Strain: Low Risk  (01/02/2024)  Physical Activity: Insufficiently Active (01/02/2024)  Social Connections: Moderately Integrated (01/02/2024)  Stress: Stress Concern Present (01/02/2024)  Tobacco Use: Low Risk  (01/02/2024)  Health Literacy: Adequate Health Literacy (01/02/2024)   Depression Screen    01/02/2024    8:16 AM 11/21/2023    8:15 AM 10/25/2023    1:02 PM 08/22/2023    3:13 PM  PHQ 2/9 Scores  PHQ - 2 Score 0 0 1 1  PHQ- 9 Score 0 2 2 7      Goals  Addressed             This Visit's Progress    Patient Stated       To lose weight        Visit info / Clinical Intake: Medicare Wellness Visit Type:: Subsequent Annual Wellness Visit Medicare Wellness Visit Mode:: Video If telephone or video:: pt reported vitals Interpreter Needed?: No Pre-visit prep was completed: no AWV questionnaire completed by patient prior to visit?: yes Date:: 01/02/24 Living arrangements:: lives with spouse/significant other Patient's Overall Health Status Rating: excellent Typical amount of pain: some Does pain affect daily life?: no Are you currently prescribed opioids?: no  Dietary Habits and Nutritional Risks How many meals a day?: 2 Eats fruit and vegetables daily?: yes Most meals are obtained by: preparing own meals; eating out; having others provide food In the last 2 weeks, have you had any of the following?: -- (none) Diabetic:: no  Functional Status Activities of Daily Living (to include ambulation/medication): (Patient-Rptd) Independent Ambulation: (Patient-Rptd) Independent Medication Administration: Independent Home Management: (Patient-Rptd) Independent Manage your own finances?: yes Primary transportation is: driving Concerns about vision?: no *vision screening is required for WTM* Concerns about hearing?: no  Fall Screening Falls in the past year?: (Patient-Rptd) 0 Number of falls in past year: 0 Was there an injury with Fall?: (Patient-Rptd) 0 Fall Risk Category Calculator: 0 Patient Fall Risk Level: Low Fall Risk  Fall Risk Patient at Risk for Falls Due to: No Fall Risks Fall risk Follow up: Falls evaluation completed; Falls prevention discussed  Home and Transportation Safety: All rugs have non-skid backing?: yes All stairs or steps have railings?: yes Grab bars in the bathtub or shower?: yes Have non-skid surface in bathtub or shower?: yes Good home lighting?: yes Regular seat belt use?: yes Hospital stays in  the last year:: no  Cognitive Assessment Difficulty concentrating, remembering, or making decisions? : no Will 6CIT or Mini Cog be Completed: yes What year is it?: 0 points What month is it?: 0 points Give patient an address phrase to remember (5 components): rememberwords apple, table , penny About what time is it?: 0 points Count backwards from 20 to 1: 0 points Say the months of the year in reverse: 0 points Repeat the address phrase from earlier: 0 points 6 CIT Score: 0 points  Advance Directives (For Healthcare) Does Patient Have a Medical Advance Directive?: No Would patient like information on creating a medical advance directive?: No - Patient declined  Reviewed/Updated  Reviewed/Updated: All        Objective:    Today's Vitals   01/02/24 0807  BP: 133/74  Weight: 181 lb (82.1 kg)  Height: 5' 8 (1.727 m)  Body mass index is 27.52 kg/m.  Current Medications (verified) Outpatient Encounter Medications as of 01/02/2024  Medication Sig   ALPRAZolam (XANAX) 0.25 MG tablet Take 1 tablet (0.25 mg total) by mouth 2 (two) times daily as needed for anxiety.   atenolol (TENORMIN) 25 MG tablet Take 1 tablet (25 mg total) by mouth at bedtime.   butalbital -acetaminophen -caffeine  (FIORICET) 50-325-40 MG tablet Take 1 tablet by mouth every 6 (six) hours as needed for headache.   dextromethorphan  (DELSYM ) 30 MG/5ML liquid Take 5 mLs (30 mg total) by mouth as needed for cough.   diphenhydrAMINE HCl (BENADRYL PO) Take 1 tablet by mouth daily as needed.   FLUoxetine  (PROZAC ) 40 MG capsule Take 1 capsule (40 mg total) by mouth daily.   ibuprofen (ADVIL) 600 MG tablet Take 600 mg by mouth every 6 (six) hours as needed.   MELATONIN PO Take 1 tablet by mouth at bedtime.   rosuvastatin (CRESTOR) 20 MG tablet Take 20 mg by mouth at bedtime.   benzonatate  (TESSALON ) 100 MG capsule Take 1 capsule (100 mg total) by mouth 3 (three) times daily as needed for cough. (Patient not taking:  Reported on 01/02/2024)   No facility-administered encounter medications on file as of 01/02/2024.   Hearing/Vision screen Hearing Screening - Comments:: No difficulties Vision Screening - Comments:: Wears contacts  Immunizations and Health Maintenance Health Maintenance  Topic Date Due   HIV Screening  Never done   Hepatitis C Screening  Never done   DTaP/Tdap/Td (1 - Tdap) Never done   Hepatitis B Vaccines 19-59 Average Risk (1 of 3 - 19+ 3-dose series) Never done   Zoster Vaccines- Shingrix (1 of 2) Never done   Pneumococcal Vaccine: 50+ Years (1 of 1 - PCV) Never done   COVID-19 Vaccine (3 - Pfizer risk series) 12/22/2019   Influenza Vaccine  05/28/2024 (Originally 09/29/2023)   Cervical Cancer Screening (HPV/Pap Cotest)  04/09/2024   Mammogram  02/01/2025   Colonoscopy  07/17/2026   HPV VACCINES  Aged Out   Meningococcal B Vaccine  Aged Out        Assessment/Plan:  This is a routine wellness examination for Annette Rojas.  Patient Care Team: Ziglar, Susan K, MD as PCP - General (Family Medicine) Cindie Jesusa HERO, RN as Registered Nurse Dannielle Arlean FALCON, RN (Inactive) as Registered Nurse  I have personally reviewed and noted the following in the patient's chart:   Medical and social history Use of alcohol, tobacco or illicit drugs  Current medications and supplements including opioid prescriptions. Functional ability and status Nutritional status Physical activity Advanced directives List of other physicians Hospitalizations, surgeries, and ER visits in previous 12 months Vitals Screenings to include cognitive, depression, and falls Referrals and appointments  No orders of the defined types were placed in this encounter.  In addition, I have reviewed and discussed with patient certain preventive protocols, quality metrics, and best practice recommendations. A written personalized care plan for preventive services as well as general preventive health recommendations were  provided to patient.   Lyle MARLA Right, [place]   01/02/2024   Return in 1 year (on 01/01/2025).  After Visit Summary: (MyChart) Due to this being a telephonic visit, the after visit summary with patients personalized plan was offered to patient via MyChart   Nurse Notes: nothing to report

## 2024-01-04 NOTE — Progress Notes (Addendum)
 I connected with  Annette Rojas on 01/02/2024 by a video and audio enabled telemedicine application and verified that I am speaking with the correct person using two identifiers.  Patient Location: Home  Provider Location: Home Office  I discussed the limitations of evaluation and management by telemedicine. The patient expressed understanding and agreed to proceed.  Vital Signs: Because this visit was a virtual/telehealth visit, some criteria may be missing or patient reported. Any vitals not documented were not able to be obtained and vitals that have been documented are patient reported.   Because this visit was a virtual/telehealth visit,  certain criteria was not obtained, such a blood pressure, CBG if applicable, and timed get up and go. Any medications not marked as taking were not mentioned during the medication reconciliation part of the visit. Any vitals not documented were not able to be obtained due to this being a telehealth visit or patient was unable to self-report a recent blood pressure reading due to a lack of equipment at home via telehealth. Vitals that have been documented are verbally provided by the patient.  This visit was performed by a medical professional under my direct supervision. I was immediately available for consultation/collaboration. I have reviewed and agree with the Annual Wellness Visit documentation.  Subjective:   Annette Rojas is a 58 y.o. female who presents for a Medicare Annual Wellness Visit.  Allergies (verified) Patient has no known allergies.   History: Past Medical History:  Diagnosis Date   Anxiety    on meds   Arthritis    lower back and sciatica   Depression    on meds   GERD (gastroesophageal reflux disease)    Hyperlipidemia    on meds   Hypertension    migraines/HTN   Migraines    Past Surgical History:  Procedure Laterality Date   BREAST BIOPSY Right 01/13/2021   us  bx, heart marker, path pending    BREAST BIOPSY  06/08/2022   MM RT RADIOACTIVE SEED LOC MAMMO GUIDE 06/08/2022 [facility]   BREAST BIOPSY  06/08/2022   MM LT RADIOACTIVE SEED LOC MAMMO GUIDE 06/08/2022 [facility]   BREAST EXCISIONAL BIOPSY Right 05/2022   fibroadenoma   BREAST EXCISIONAL BIOPSY Left 05/2022   papilloma   BREAST LUMPECTOMY WITH RADIOACTIVE SEED LOCALIZATION Bilateral 06/09/2022   Procedure: BILATERAL BREAST LUMPECTOMY WITH RADIOACTIVE SEED LOCALIZATION;  Surgeon: Belinda Cough, MD;  Location: [facility];  Service: General;  Laterality: Bilateral;   COLONOSCOPY     SPINE SURGERY     Family History  Problem Relation Age of Onset   Colon polyps Mother 51   Colon cancer Mother 80   Anxiety disorder Mother    Arthritis Mother    Cancer Mother    Diabetes Mother    Colon cancer Maternal Aunt    Colon cancer Maternal Uncle    Breast cancer Paternal Aunt 32   Breast cancer Paternal Aunt 96   Breast cancer Cousin 50       maternal side   Cancer Father    Cancer Brother    Diabetes Brother    Kidney disease Brother    Cancer Maternal Aunt    Intellectual disability Son    Learning disabilities Son    Esophageal cancer Neg Hx    Stomach cancer Neg Hx    Rectal cancer Neg Hx    Social History   Occupational History   Not on file  Tobacco Use  Smoking status: Never    Passive exposure: Never   Smokeless tobacco: Never  Vaping Use   Vaping status: Never Used  Substance and Sexual Activity   Alcohol use: Yes    Alcohol/week: 1.0 standard drink of alcohol    Types: 1 Standard drinks or equivalent per week    Comment: 3-4 drinks a month   Drug use: Never   Sexual activity: Yes    Birth control/protection: Other-see comments    Comment: Husband had vasectomy   Tobacco Counseling Counseling given: Not Answered  SDOH Screenings   Food Insecurity: No Food Insecurity (01/02/2024)  Housing: Low Risk  (01/02/2024)  Transportation Needs: No Transportation Needs  (01/02/2024)  Utilities: Not At Risk (01/02/2024)  Alcohol Screen: Low Risk  (01/02/2024)  Depression (PHQ2-9): Low Risk  (01/02/2024)  Financial Resource Strain: Low Risk  (01/02/2024)  Physical Activity: Insufficiently Active (01/02/2024)  Social Connections: Moderately Integrated (01/02/2024)  Stress: Stress Concern Present (01/02/2024)  Tobacco Use: Low Risk  (01/02/2024)  Health Literacy: Adequate Health Literacy (01/02/2024)   Depression Screen    01/02/2024    8:16 AM 11/21/2023    8:15 AM 10/25/2023    1:02 PM 08/22/2023    3:13 PM  PHQ 2/9 Scores  PHQ - 2 Score 0 0 1 1  PHQ- 9 Score 0  2  2  7       Data saved with a previous flowsheet row definition     Goals Addressed             This Visit's Progress    Patient Stated       To lose weight        Visit info / Clinical Intake: Medicare Wellness Visit Type:: Subsequent Annual Wellness Visit Medicare Wellness Visit Mode:: Video If telephone or video:: pt reported vitals Interpreter Needed?: No Pre-visit prep was completed: no AWV questionnaire completed by patient prior to visit?: yes Date:: 01/02/24 Living arrangements:: lives with spouse/significant other Patient's Overall Health Status Rating: excellent Typical amount of pain: some Does pain affect daily life?: no Are you currently prescribed opioids?: no  Dietary Habits and Nutritional Risks How many meals a day?: 2 Eats fruit and vegetables daily?: yes Most meals are obtained by: preparing own meals; eating out; having others provide food In the last 2 weeks, have you had any of the following?: -- (none) Diabetic:: no  Functional Status Activities of Daily Living (to include ambulation/medication): (Patient-Rptd) Independent Ambulation: (Patient-Rptd) Independent Medication Administration: Independent Home Management: (Patient-Rptd) Independent Manage your own finances?: yes Primary transportation is: driving Concerns about vision?: no *vision screening  is required for WTM* Concerns about hearing?: no  Fall Screening Falls in the past year?: (Patient-Rptd) 0 Number of falls in past year: 0 Was there an injury with Fall?: (Patient-Rptd) 0 Fall Risk Category Calculator: 0 Patient Fall Risk Level: Low Fall Risk  Fall Risk Patient at Risk for Falls Due to: No Fall Risks Fall risk Follow up: Falls evaluation completed; Falls prevention discussed  Home and Transportation Safety: All rugs have non-skid backing?: yes All stairs or steps have railings?: yes Grab bars in the bathtub or shower?: yes Have non-skid surface in bathtub or shower?: yes Good home lighting?: yes Regular seat belt use?: yes Hospital stays in the last year:: no  Cognitive Assessment Difficulty concentrating, remembering, or making decisions? : no Will 6CIT or Mini Cog be Completed: yes What year is it?: 0 points What month is it?: 0 points Give patient an address  phrase to remember (5 components): rememberwords apple, table , penny About what time is it?: 0 points Count backwards from 20 to 1: 0 points Say the months of the year in reverse: 0 points Repeat the address phrase from earlier: 0 points 6 CIT Score: 0 points  Advance Directives (For Healthcare) Does Patient Have a Medical Advance Directive?: No Would patient like information on creating a medical advance directive?: No - Patient declined  Reviewed/Updated  Reviewed/Updated: All        Objective:    Today's Vitals   01/02/24 0807  BP: 133/74  Weight: 181 lb (82.1 kg)  Height: 5' 8 (1.727 m)   Body mass index is 27.52 kg/m.  Current Medications (verified) Outpatient Encounter Medications as of 01/02/2024  Medication Sig   ALPRAZolam (XANAX) 0.25 MG tablet Take 1 tablet (0.25 mg total) by mouth 2 (two) times daily as needed for anxiety.   atenolol (TENORMIN) 25 MG tablet Take 1 tablet (25 mg total) by mouth at bedtime.   butalbital -acetaminophen -caffeine  (FIORICET) 50-325-40 MG  tablet Take 1 tablet by mouth every 6 (six) hours as needed for headache.   dextromethorphan  (DELSYM ) 30 MG/5ML liquid Take 5 mLs (30 mg total) by mouth as needed for cough.   diphenhydrAMINE HCl (BENADRYL PO) Take 1 tablet by mouth daily as needed.   FLUoxetine  (PROZAC ) 40 MG capsule Take 1 capsule (40 mg total) by mouth daily.   ibuprofen (ADVIL) 600 MG tablet Take 600 mg by mouth every 6 (six) hours as needed.   MELATONIN PO Take 1 tablet by mouth at bedtime.   rosuvastatin (CRESTOR) 20 MG tablet Take 20 mg by mouth at bedtime.   benzonatate  (TESSALON ) 100 MG capsule Take 1 capsule (100 mg total) by mouth 3 (three) times daily as needed for cough. (Patient not taking: Reported on 01/02/2024)   No facility-administered encounter medications on file as of 01/02/2024.   Hearing/Vision screen Hearing Screening - Comments:: No difficulties Vision Screening - Comments:: Wears contacts  Immunizations and Health Maintenance Health Maintenance  Topic Date Due   HIV Screening  Never done   Hepatitis C Screening  Never done   DTaP/Tdap/Td (1 - Tdap) Never done   Hepatitis B Vaccines 19-59 Average Risk (1 of 3 - 19+ 3-dose series) Never done   Zoster Vaccines- Shingrix (1 of 2) Never done   Pneumococcal Vaccine: 50+ Years (1 of 1 - PCV) Never done   COVID-19 Vaccine (3 - Pfizer risk series) 12/22/2019   Influenza Vaccine  05/28/2024 (Originally 09/29/2023)   Cervical Cancer Screening (HPV/Pap Cotest)  04/09/2024   Mammogram  02/01/2025   Colonoscopy  07/17/2026   HPV VACCINES  Aged Out   Meningococcal B Vaccine  Aged Out        Assessment/Plan:  This is a routine wellness examination for Shalana.  Patient Care Team: Ziglar, Susan K, MD as PCP - General (Family Medicine) Cindie Jesusa HERO, RN as Registered Nurse Dannielle Arlean FALCON, RN (Inactive) as Registered Nurse  I have personally reviewed and noted the following in the patient's chart:   Medical and social history Use of alcohol, tobacco  or illicit drugs  Current medications and supplements including opioid prescriptions. Functional ability and status Nutritional status Physical activity Advanced directives List of other physicians Hospitalizations, surgeries, and ER visits in previous 12 months Vitals Screenings to include cognitive, depression, and falls Referrals and appointments  No orders of the defined types were placed in this encounter.  In addition, I have  reviewed and discussed with patient certain preventive protocols, quality metrics, and best practice recommendations. A written personalized care plan for preventive services as well as general preventive health recommendations were provided to patient.   Lyle MARLA Right, [place]   01/04/2024   Return in 1 year (on 01/01/2025).  After Visit Summary: (MyChart) Due to this being a telephonic visit, the after visit summary with patients personalized plan was offered to patient via MyChart   Nurse Notes: nothing to report

## 2024-01-26 NOTE — Pre-Procedure Instructions (Signed)
 Surgical Instructions   Your procedure is scheduled on February 01, 2024. Report to [facility] Main Entrance A at 7:20 A.M., then check in with the Admitting office. Any questions or running late day of surgery: call [phone]  Questions prior to your surgery date: call [phone], Monday-Friday, 8am-4pm. If you experience any cold or flu symptoms such as cough, fever, chills, shortness of breath, etc. between now and your scheduled surgery, please notify us  at the above number.     Remember:  Do not eat after midnight the night before your surgery   You may drink clear liquids until 6:20 AM the morning of your surgery.   Clear liquids allowed are: Water, Non-Citrus Juices (without pulp), Carbonated Beverages, Clear Tea (no milk, honey, etc.), Black Coffee Only (NO MILK, CREAM OR POWDERED CREAMER of any kind), and Gatorade.    Take these medicines the morning of surgery with A SIP OF WATER: FLUoxetine  (PROZAC )    May take these medicines IF NEEDED: acetaminophen  (TYLENOL )  ALPRAZolam  (XANAX )  butalbital -acetaminophen -caffeine  (FIORICET)  diphenhydrAMINE (BENADRYL)    One week prior to surgery, STOP taking any Aspirin (unless otherwise instructed by your surgeon) Aleve, Naproxen, Ibuprofen, Motrin, Advil, Goody's, BC's, all herbal medications, fish oil, and non-prescription vitamins.                     Do NOT Smoke (Tobacco/Vaping) for 24 hours prior to your procedure.  If you use a CPAP at night, you may bring your mask/headgear for your overnight stay.   You will be asked to remove any contacts, glasses, piercing's, hearing aid's, dentures/partials prior to surgery. Please bring cases for these items if needed.    Patients discharged the day of surgery will not be allowed to drive home, and someone needs to stay with them for 24 hours.  SURGICAL WAITING ROOM VISITATION Patients may have no more than 2 support people in the waiting area - these visitors may rotate.    Pre-op nurse will coordinate an appropriate time for 1 ADULT support person, who may not rotate, to accompany patient in pre-op.  Children under the age of 44 must have an adult with them who is not the patient and must remain in the main waiting area with an adult.  If the patient needs to stay at the hospital during part of their recovery, the visitor guidelines for inpatient rooms apply.  Please refer to the [facility] website for the visitor guidelines for any additional information.   If you received a COVID test during your pre-op visit  it is requested that you wear a mask when out in public, stay away from anyone that may not be feeling well and notify your surgeon if you develop symptoms. If you have been in contact with anyone that has tested positive in the last 10 days please notify you surgeon.      Pre-operative CHG Bathing Instructions   You can play a key role in reducing the risk of infection after surgery. Your skin needs to be as free of germs as possible. You can reduce the number of germs on your skin by washing with CHG (chlorhexidine  gluconate) soap before surgery. CHG is an antiseptic soap that kills germs and continues to kill germs even after washing.   DO NOT use if you have an allergy to chlorhexidine /CHG or antibacterial soaps. If your skin becomes reddened or irritated, stop using the CHG and notify one of our RNs at [phone].  TAKE A SHOWER THE NIGHT BEFORE SURGERY   Please keep in mind the following:  DO NOT shave, including legs and underarms, 48 hours prior to surgery.   You may shave your face before/day of surgery.  Place clean sheets on your bed the night before surgery Use a clean washcloth (not used since being washed) for shower. DO NOT sleep with pet's night before surgery.  CHG Shower Instructions:  Wash your face and private area with normal soap. If you choose to wash your hair, wash first with your normal shampoo.  After  you use shampoo/soap, rinse your hair and body thoroughly to remove shampoo/soap residue.  Turn the water OFF and apply half the bottle of CHG soap to a CLEAN washcloth.  Apply CHG soap ONLY FROM YOUR NECK DOWN TO YOUR TOES (washing for 3-5 minutes)  DO NOT use CHG soap on face, private areas, open wounds, or sores.  Pay special attention to the area where your surgery is being performed.  If you are having back surgery, having someone wash your back for you may be helpful. Wait 2 minutes after CHG soap is applied, then you may rinse off the CHG soap.  Pat dry with a clean towel  Put on clean pajamas    Additional instructions for the day of surgery: If you choose, you may shower the morning of surgery with an antibacterial soap.  DO NOT APPLY any lotions, deodorants, cologne, or perfumes.   Do not wear jewelry or makeup Do not wear nail polish, gel polish, artificial nails, or any other type of covering on natural nails (fingers and toes) Do not bring valuables to the hospital. [facility] is not responsible for valuables/personal belongings. Put on clean/comfortable clothes.  Please brush your teeth.  Ask your nurse before applying any prescription medications to the skin.

## 2024-01-29 ENCOUNTER — Other Ambulatory Visit: Payer: Self-pay

## 2024-01-29 ENCOUNTER — Encounter (HOSPITAL_COMMUNITY)
Admission: RE | Admit: 2024-01-29 | Discharge: 2024-01-29 | Disposition: A | Source: Ambulatory Visit | Attending: Surgery

## 2024-01-29 ENCOUNTER — Encounter (HOSPITAL_COMMUNITY): Payer: Self-pay

## 2024-01-29 VITALS — BP 126/67 | Temp 98.2°F | Resp 18 | Ht 68.0 in | Wt 185.0 lb

## 2024-01-29 DIAGNOSIS — Z01812 Encounter for preprocedural laboratory examination: Secondary | ICD-10-CM | POA: Diagnosis not present

## 2024-01-29 DIAGNOSIS — Z01818 Encounter for other preprocedural examination: Secondary | ICD-10-CM

## 2024-01-29 HISTORY — DX: Family history of other specified conditions: Z84.89

## 2024-01-29 LAB — CBC
HCT: 40.5 % (ref 36.0–46.0)
Hemoglobin: 13.5 g/dL (ref 12.0–15.0)
MCH: 30.9 pg (ref 26.0–34.0)
MCHC: 33.3 g/dL (ref 30.0–36.0)
MCV: 92.7 fL (ref 80.0–100.0)
Platelets: 236 K/uL (ref 150–400)
RBC: 4.37 MIL/uL (ref 3.87–5.11)
RDW: 12.7 % (ref 11.5–15.5)
WBC: 5.6 K/uL (ref 4.0–10.5)
nRBC: 0 % (ref 0.0–0.2)

## 2024-01-29 LAB — BASIC METABOLIC PANEL WITH GFR
Anion gap: 8 (ref 5–15)
BUN: 18 mg/dL (ref 6–20)
CO2: 26 mmol/L (ref 22–32)
Calcium: 9.2 mg/dL (ref 8.9–10.3)
Chloride: 104 mmol/L (ref 98–111)
Creatinine, Ser: 0.91 mg/dL (ref 0.44–1.00)
GFR, Estimated: >60 mL/min (ref >60)
Glucose, Bld: 113 mg/dL — ABNORMAL HIGH (ref 70–99)
Potassium: 4 mmol/L (ref 3.5–5.1)
Sodium: 138 mmol/L (ref 135–145)

## 2024-01-29 NOTE — Progress Notes (Signed)
 PCP - Devere Mock, MD Cardiologist - denies  PPM/ICD - denies Device Orders -  Rep Notified -   Chest x-ray - 08/21/23 EKG - 08/21/23 Stress Test - denies ECHO - denies Cardiac Cath - denies  Sleep Study - denies CPAP - no  Fasting Blood Sugar - na Checks Blood Sugar _____ times a day  Last dose of GLP1 agonist-  na GLP1 instructions: na  Blood Thinner Instructions:na Aspirin Instructions:na  ERAS Protcol -clears until 0620 PRE-SURGERY Ensure or G2- no  COVID TEST- na   Anesthesia review: no  Patient denies shortness of breath, fever, cough and chest pain at PAT appointment   All instructions explained to the patient, with a verbal understanding of the material. Patient agrees to go over the instructions while at home for a better understanding. The opportunity to ask questions was provided.

## 2024-02-01 ENCOUNTER — Ambulatory Visit (HOSPITAL_COMMUNITY): Admitting: Anesthesiology

## 2024-02-01 ENCOUNTER — Ambulatory Visit (HOSPITAL_COMMUNITY)

## 2024-02-01 ENCOUNTER — Encounter (HOSPITAL_COMMUNITY): Admission: RE | Disposition: A | Payer: Self-pay | Source: Home / Self Care | Attending: Surgery

## 2024-02-01 ENCOUNTER — Ambulatory Visit (HOSPITAL_COMMUNITY): Admission: RE | Admit: 2024-02-01 | Discharge: 2024-02-01 | Disposition: A | Attending: Surgery | Admitting: Surgery

## 2024-02-01 DIAGNOSIS — Z0189 Encounter for other specified special examinations: Secondary | ICD-10-CM | POA: Diagnosis not present

## 2024-02-01 DIAGNOSIS — K811 Chronic cholecystitis: Secondary | ICD-10-CM | POA: Diagnosis not present

## 2024-02-01 DIAGNOSIS — K8018 Calculus of gallbladder with other cholecystitis without obstruction: Secondary | ICD-10-CM | POA: Diagnosis not present

## 2024-02-01 HISTORY — PX: CHOLECYSTECTOMY: SHX55

## 2024-02-01 SURGERY — LAPAROSCOPIC CHOLECYSTECTOMY WITH INTRAOPERATIVE CHOLANGIOGRAM
Anesthesia: General | Site: Abdomen

## 2024-02-01 MED ORDER — MIDAZOLAM HCL 2 MG/2ML IJ SOLN
INTRAMUSCULAR | Status: AC
  Filled 2024-02-01: qty 2

## 2024-02-01 MED ORDER — ROCURONIUM BROMIDE 10 MG/ML (PF) SYRINGE
PREFILLED_SYRINGE | INTRAVENOUS | Status: DC | PRN
  Administered 2024-02-01: 50 mg via INTRAVENOUS

## 2024-02-01 MED ORDER — HYDROMORPHONE HCL 1 MG/ML IJ SOLN
0.2500 mg | INTRAMUSCULAR | Status: DC | PRN
  Administered 2024-02-01 (×2): 0.5 mg via INTRAVENOUS

## 2024-02-01 MED ORDER — CHLORHEXIDINE GLUCONATE CLOTH 2 % EX PADS: 6.0000 | MEDICATED_PAD | Freq: Once | CUTANEOUS | Status: DC

## 2024-02-01 MED ORDER — SODIUM CHLORIDE 0.9 % IV SOLN
INTRAVENOUS | Status: DC | PRN
  Administered 2024-02-01: 10 mL

## 2024-02-01 MED ORDER — MIDAZOLAM HCL (PF) 2 MG/2ML IJ SOLN
INTRAMUSCULAR | Status: DC | PRN
  Administered 2024-02-01: 2 mg via INTRAVENOUS

## 2024-02-01 MED ORDER — ACETAMINOPHEN 500 MG PO TABS
1000.0000 mg | ORAL_TABLET | ORAL | Status: AC
  Administered 2024-02-01: 1000 mg via ORAL
  Filled 2024-02-01: qty 2

## 2024-02-01 MED ORDER — ONDANSETRON HCL 4 MG/2ML IJ SOLN
INTRAMUSCULAR | Status: DC | PRN
  Administered 2024-02-01: 4 mg via INTRAVENOUS

## 2024-02-01 MED ORDER — DROPERIDOL 2.5 MG/ML IJ SOLN
0.6250 mg | Freq: Once | INTRAMUSCULAR | Status: AC
  Administered 2024-02-01: 0.625 mg via INTRAVENOUS

## 2024-02-01 MED ORDER — CHLORHEXIDINE GLUCONATE CLOTH 2 % EX PADS
6.0000 | MEDICATED_PAD | Freq: Once | CUTANEOUS | Status: AC
  Administered 2024-02-01: 6 via TOPICAL

## 2024-02-01 MED ORDER — SODIUM CHLORIDE 0.9 % IR SOLN
Status: DC | PRN
  Administered 2024-02-01: 1000 mL

## 2024-02-01 MED ORDER — CEFAZOLIN SODIUM-DEXTROSE 2-4 GM/100ML-% IV SOLN
2.0000 g | INTRAVENOUS | Status: AC
  Administered 2024-02-01: 2 g via INTRAVENOUS
  Filled 2024-02-01: qty 100

## 2024-02-01 MED ORDER — PROPOFOL 10 MG/ML IV BOLUS
INTRAVENOUS | Status: DC | PRN
  Administered 2024-02-01: 130 mg via INTRAVENOUS

## 2024-02-01 MED ORDER — FENTANYL CITRATE (PF) 100 MCG/2ML IJ SOLN
INTRAMUSCULAR | Status: DC | PRN
  Administered 2024-02-01: 100 ug via INTRAVENOUS

## 2024-02-01 MED ORDER — BUPIVACAINE-EPINEPHRINE 0.25% -1:200000 IJ SOLN
INTRAMUSCULAR | Status: DC | PRN
  Administered 2024-02-01: 8 mL

## 2024-02-01 MED ORDER — 0.9 % SODIUM CHLORIDE (POUR BTL) OPTIME
TOPICAL | Status: DC | PRN
  Administered 2024-02-01: 1000 mL

## 2024-02-01 MED ORDER — DEXAMETHASONE SOD PHOSPHATE PF 10 MG/ML IJ SOLN
INTRAMUSCULAR | Status: DC | PRN
  Administered 2024-02-01: 5 mg via INTRAVENOUS

## 2024-02-01 MED ORDER — PHENYLEPHRINE 80 MCG/ML (10ML) SYRINGE FOR IV PUSH (FOR BLOOD PRESSURE SUPPORT)
PREFILLED_SYRINGE | INTRAVENOUS | Status: DC | PRN
  Administered 2024-02-01 (×2): 80 ug via INTRAVENOUS

## 2024-02-01 MED ORDER — PROPOFOL 10 MG/ML IV BOLUS
INTRAVENOUS | Status: AC
  Filled 2024-02-01: qty 20

## 2024-02-01 MED ORDER — FENTANYL CITRATE (PF) 100 MCG/2ML IJ SOLN
INTRAMUSCULAR | Status: AC
  Filled 2024-02-01: qty 2

## 2024-02-01 MED ORDER — OXYCODONE HCL 5 MG PO TABS: 5.0000 mg | ORAL_TABLET | Freq: Four times a day (QID) | ORAL | 0 refills | Status: AC | PRN

## 2024-02-01 MED ORDER — KETOROLAC TROMETHAMINE 30 MG/ML IJ SOLN
INTRAMUSCULAR | Status: DC | PRN
  Administered 2024-02-01: 30 mg via INTRAVENOUS

## 2024-02-01 MED ORDER — PROPOFOL 500 MG/50ML IV EMUL
INTRAVENOUS | Status: DC | PRN
  Administered 2024-02-01: 150 ug/kg/min via INTRAVENOUS

## 2024-02-01 MED ORDER — SUGAMMADEX SODIUM 200 MG/2ML IV SOLN
INTRAVENOUS | Status: DC | PRN
  Administered 2024-02-01: 200 mg via INTRAVENOUS

## 2024-02-01 MED ORDER — DROPERIDOL 2.5 MG/ML IJ SOLN
INTRAMUSCULAR | Status: AC
  Filled 2024-02-01: qty 2

## 2024-02-01 MED ORDER — LACTATED RINGERS IV SOLN: INTRAVENOUS | Status: DC

## 2024-02-01 MED ORDER — BUPIVACAINE-EPINEPHRINE (PF) 0.25% -1:200000 IJ SOLN
INTRAMUSCULAR | Status: AC
  Filled 2024-02-01: qty 30

## 2024-02-01 MED ORDER — CHLORHEXIDINE GLUCONATE 0.12 % MT SOLN
15.0000 mL | Freq: Once | OROMUCOSAL | Status: AC
  Administered 2024-02-01: 15 mL via OROMUCOSAL
  Filled 2024-02-01: qty 15

## 2024-02-01 MED ORDER — HYDROMORPHONE HCL 1 MG/ML IJ SOLN
INTRAMUSCULAR | Status: AC
  Filled 2024-02-01: qty 1

## 2024-02-01 MED ORDER — ORAL CARE MOUTH RINSE: 15.0000 mL | Freq: Once | OROMUCOSAL | Status: AC

## 2024-02-01 MED ORDER — LIDOCAINE 2% (20 MG/ML) 5 ML SYRINGE
INTRAMUSCULAR | Status: DC | PRN
  Administered 2024-02-01: 60 mg via INTRAVENOUS

## 2024-02-01 SURGICAL SUPPLY — 40 items
BAG COUNTER SPONGE SURGICOUNT (BAG) ×1 IMPLANT
BENZOIN TINCTURE PRP APPL 2/3 (GAUZE/BANDAGES/DRESSINGS) ×1 IMPLANT
BLADE CLIPPER SURG (BLADE) IMPLANT
CANISTER SUCTION 3000ML PPV (SUCTIONS) ×1 IMPLANT
CHLORAPREP W/TINT 26 (MISCELLANEOUS) ×1 IMPLANT
CLIP APPLIE ROT 10 11.4 M/L (STAPLE) ×1 IMPLANT
COVER MAYO STAND STRL (DRAPES) ×1 IMPLANT
COVER SURGICAL LIGHT HANDLE (MISCELLANEOUS) ×1 IMPLANT
DRAPE C-ARM 42X120 X-RAY (DRAPES) ×1 IMPLANT
DRSG TEGADERM 2-3/8X2-3/4 SM (GAUZE/BANDAGES/DRESSINGS) ×3 IMPLANT
DRSG TEGADERM 4X4.75 (GAUZE/BANDAGES/DRESSINGS) ×1 IMPLANT
ELECTRODE REM PT RTRN 9FT ADLT (ELECTROSURGICAL) ×1 IMPLANT
GAUZE SPONGE 2X2 8PLY STRL LF (GAUZE/BANDAGES/DRESSINGS) ×1 IMPLANT
GAUZE SPONGE 2X2 STRL 8-PLY (GAUZE/BANDAGES/DRESSINGS) IMPLANT
GLOVE BIO SURGEON STRL SZ7 (GLOVE) ×1 IMPLANT
GLOVE BIOGEL PI IND STRL 7.5 (GLOVE) ×1 IMPLANT
GOWN STRL REUS W/ TWL LRG LVL3 (GOWN DISPOSABLE) ×3 IMPLANT
IRRIGATION SUCT STRKRFLW 2 WTP (MISCELLANEOUS) ×1 IMPLANT
KIT BASIN OR (CUSTOM PROCEDURE TRAY) ×1 IMPLANT
KIT IMAGING PINPOINTPAQ (MISCELLANEOUS) IMPLANT
KIT TURNOVER KIT B (KITS) ×1 IMPLANT
PAD ARMBOARD POSITIONER FOAM (MISCELLANEOUS) ×1 IMPLANT
POUCH RETRIEVAL ECOSAC 10 (ENDOMECHANICALS) IMPLANT
SCISSORS LAP 5X35 DISP (ENDOMECHANICALS) ×1 IMPLANT
SET CHOLANGIOGRAPH 5 50 .035 (SET/KITS/TRAYS/PACK) ×1 IMPLANT
SET TUBE SMOKE EVAC HIGH FLOW (TUBING) ×1 IMPLANT
SLEEVE Z-THREAD 5X100MM (TROCAR) ×1 IMPLANT
SOLN 0.9% NACL POUR BTL 1000ML (IV SOLUTION) ×1 IMPLANT
SOLN STERILE WATER BTL 1000 ML (IV SOLUTION) ×1 IMPLANT
STRIP CLOSURE SKIN 1/2X4 (GAUZE/BANDAGES/DRESSINGS) ×1 IMPLANT
SUT MNCRL AB 4-0 PS2 18 (SUTURE) ×1 IMPLANT
SUT VICRYL 0 UR6 27IN ABS (SUTURE) IMPLANT
SYSTEM BAG RETRIEVAL 10MM (BASKET) IMPLANT
TOWEL GREEN STERILE (TOWEL DISPOSABLE) ×1 IMPLANT
TOWEL GREEN STERILE FF (TOWEL DISPOSABLE) ×1 IMPLANT
TRAY LAPAROSCOPIC MC (CUSTOM PROCEDURE TRAY) ×1 IMPLANT
TROCAR 11X100 Z THREAD (TROCAR) ×1 IMPLANT
TROCAR BALLN 12MMX100 BLUNT (TROCAR) ×1 IMPLANT
TROCAR Z-THREAD OPTICAL 5X100M (TROCAR) ×1 IMPLANT
WARMER LAPAROSCOPE (MISCELLANEOUS) ×1 IMPLANT

## 2024-02-01 NOTE — Discharge Instructions (Signed)
 [facility] ______[facility] LAPAROSCOPIC SURGERY: POST OP INSTRUCTIONS Always review your discharge instruction sheet given to you by the facility where your surgery was performed. IF YOU HAVE DISABILITY OR FAMILY LEAVE FORMS, YOU MUST BRING THEM TO THE OFFICE FOR PROCESSING.   DO NOT GIVE THEM TO YOUR DOCTOR.  A prescription for pain medication may be given to you upon discharge.  Take your pain medication as prescribed, if needed.  If narcotic pain medicine is not needed, then you may take acetaminophen  (Tylenol ) or ibuprofen  (Advil ) as needed. Take your usually prescribed medications unless otherwise directed. If you need a refill on your pain medication, please contact your pharmacy.  They will contact our office to request authorization. Prescriptions will not be filled after 5pm or on week-ends. You should follow a light diet the first few days after arrival home, such as soup and crackers, etc.  Be sure to include lots of fluids daily. Most patients will experience some swelling and bruising in the area of the incisions.  Ice packs will help.  Swelling and bruising can take several days to resolve.  It is common to experience some constipation if taking pain medication after surgery.  Increasing fluid intake and taking a stool softener (such as Colace) will usually help or prevent this problem from occurring.  A mild laxative (Milk of Magnesia or Miralax) should be taken according to package instructions if there are no bowel movements after 48 hours. Unless discharge instructions indicate otherwise, you may remove your bandages 24-48 hours after surgery, and you may shower at that time.  You may have steri-strips (small skin tapes) in place directly over the incision.  These strips should be left on the skin for 7-10 days.  If your surgeon used skin glue on the incision, you may shower in 24 hours.  The glue will flake off over the next 2-3 weeks.  Any sutures or staples will be  removed at the office during your follow-up visit. ACTIVITIES:  You may resume regular (light) daily activities beginning the next day--such as daily self-care, walking, climbing stairs--gradually increasing activities as tolerated.  You may have sexual intercourse when it is comfortable.  Refrain from any heavy lifting or straining until approved by your doctor. You may drive when you are no longer taking prescription pain medication, you can comfortably wear a seatbelt, and you can safely maneuver your car and apply brakes. RETURN TO WORK:  __________________________________________________________ Annette Rojas should see your doctor in the office for a follow-up appointment approximately 2-3 weeks after your surgery.  Make sure that you call for this appointment within a day or two after you arrive home to insure a convenient appointment time. OTHER INSTRUCTIONS: __________________________________________________________________________________________________________________________ __________________________________________________________________________________________________________________________ WHEN TO CALL YOUR DOCTOR: Fever over 101.0 Inability to urinate Continued bleeding from incision. Increased pain, redness, or drainage from the incision. Increasing abdominal pain  The clinic staff is available to answer your questions during regular business hours.  Please don't hesitate to call and ask to speak to one of the nurses for clinical concerns.  If you have a medical emergency, go to the nearest emergency room or call 911.  A surgeon from [facility] is always on call at the hospital. [address], [address], [place], [place]  [address] ? [address] [address], [place], [place]   [address] [phone] ? [phone] ? FAX [phone] Web site: [email or web address]

## 2024-02-01 NOTE — Anesthesia Preprocedure Evaluation (Addendum)
 Anesthesia Evaluation  Patient identified by MRN, date of birth, ID band Patient awake    Reviewed: Allergy & Precautions, H&P , NPO status , Patient's Chart, lab work & pertinent test results  Airway Mallampati: II  TM Distance: >3 FB Neck ROM: Full    Dental no notable dental hx. (+) Teeth Intact, Dental Advisory Given   Pulmonary neg pulmonary ROS   Pulmonary exam normal breath sounds clear to auscultation       Cardiovascular hypertension, Pt. on medications and Pt. on home beta blockers  Rhythm:Regular Rate:Normal     Neuro/Psych  Headaches  Anxiety Depression       GI/Hepatic Neg liver ROS,GERD  Medicated,,  Endo/Other  negative endocrine ROS    Renal/GU negative Renal ROS  negative genitourinary   Musculoskeletal  (+) Arthritis , Osteoarthritis,    Abdominal   Peds  Hematology negative hematology ROS (+)   Anesthesia Other Findings   Reproductive/Obstetrics negative OB ROS                              Anesthesia Physical Anesthesia Plan  ASA: 2  Anesthesia Plan: General   Post-op Pain Management: Tylenol  PO (pre-op)*   Induction: Intravenous  PONV Risk Score and Plan: 4 or greater and Ondansetron , Dexamethasone  and Midazolam   Airway Management Planned: Oral ETT  Additional Equipment:   Intra-op Plan:   Post-operative Plan: Extubation in OR  Informed Consent: I have reviewed the patients History and Physical, chart, labs and discussed the procedure including the risks, benefits and alternatives for the proposed anesthesia with the patient or authorized representative who has indicated his/her understanding and acceptance.     Dental advisory given  Plan Discussed with: CRNA  Anesthesia Plan Comments:          Anesthesia Quick Evaluation

## 2024-02-01 NOTE — Anesthesia Postprocedure Evaluation (Signed)
 Anesthesia Post Note  Patient: Annette Rojas  Procedure(s) Performed: LAPAROSCOPIC CHOLECYSTECTOMY WITH INTRAOPERATIVE CHOLANGIOGRAM (Abdomen)     Patient location during evaluation: PACU Anesthesia Type: General Level of consciousness: awake and alert Pain management: pain level controlled Vital Signs Assessment: post-procedure vital signs reviewed and stable Respiratory status: spontaneous breathing, nonlabored ventilation and respiratory function stable Cardiovascular status: blood pressure returned to baseline and stable Postop Assessment: no apparent nausea or vomiting Anesthetic complications: no   There were no known notable events for this encounter.  Last Vitals:  Vitals:   02/01/24 1215 02/01/24 1230  BP: (!) 102/52 (!) 98/56  Pulse: 66 76  Resp: (!) 21 17  Temp:  36.8 C  SpO2: 92% 93%    Last Pain:  Vitals:   02/01/24 1215  TempSrc:   PainSc: Asleep                 Stepfanie Yott,W. EDMOND

## 2024-02-01 NOTE — Anesthesia Procedure Notes (Signed)
 Procedure Name: Intubation Date/Time: 02/01/2024 9:49 AM  Performed by: Theophilus Burnet, Aloysius Pour, CRNAPre-anesthesia Checklist: Patient identified, Emergency Drugs available, Suction available and Patient being monitored Patient Re-evaluated:Patient Re-evaluated prior to induction Oxygen Delivery Method: Circle system utilized Preoxygenation: Pre-oxygenation with 100% oxygen Induction Type: IV induction Ventilation: Mask ventilation without difficulty Laryngoscope Size: Mac and 4 Grade View: Grade I Tube type: Oral Tube size: 7.0 mm Number of attempts: 1 Airway Equipment and Method: Stylet Placement Confirmation: ETT inserted through vocal cords under direct vision, positive ETCO2 and breath sounds checked- equal and bilateral Secured at: 22 cm Tube secured with: Tape Dental Injury: Teeth and Oropharynx as per pre-operative assessment

## 2024-02-01 NOTE — Transfer of Care (Signed)
 Immediate Anesthesia Transfer of Care Note  Patient: Annette Rojas  Procedure(s) Performed: LAPAROSCOPIC CHOLECYSTECTOMY WITH INTRAOPERATIVE CHOLANGIOGRAM (Abdomen)  Patient Location: PACU  Anesthesia Type:General  Level of Consciousness: awake  Airway & Oxygen Therapy: Patient Spontanous Breathing  Post-op Assessment: Report given to RN  Post vital signs: stable  Last Vitals:  Vitals Value Taken Time  BP 116/48 02/01/24 11:19  Temp 36.7 C 02/01/24 11:19  Pulse 70 02/01/24 11:24  Resp 18 02/01/24 11:24  SpO2 96 % 02/01/24 11:24  Vitals shown include unfiled device data.  Last Pain:  Vitals:   02/01/24 1119  TempSrc:   PainSc: Asleep         Complications: There were no known notable events for this encounter.

## 2024-02-01 NOTE — H&P (Signed)
 Subjective    Chief Complaint: NEW PROBLEM (RUQ pain/ hyperkinetic gallbladder)       History of Present Illness: Annette Rojas is a 58 y.o. female who is seen today as an office consultation at the request of Dr. Ziglar for evaluation of NEW PROBLEM (RUQ pain/ hyperkinetic gallbladder) .     This is a 58 year old postmenopausal female who presents with a 3-year history of intermittent bilateral clear nipple discharge.  She also has had intermittent left breast tenderness.  She underwent a thorough work-up including mammograms, ultrasounds, MRI, and bilateral breast biopsies.  She denies any history of bloody nipple discharge.  No previous breast biopsies.  No previous surgeries.   In the right retroareolar breast at 8:00, there is a 6 x 3 x 3 mm mass that was biopsied as a fibroadenoma.  The left lower inner quadrant shows a 6 mm intraductal papilloma.  She had another biopsy in the left upper inner quadrant that was benign.   She has a family history of breast cancer in 2 different paternal aunts and a maternal first cousin.  Both of her parents died within the last couple of years from neuroendocrine tumors.   She was seen a year ago and we recommended bilateral radioactive seed localized lumpectomies.  These procedures were denied by insurance.  She has switched insurance companies.  Follow-up mammogram in December showed no significant changes.  There are no concerning findings for malignancy.  She presents now to discuss scheduling surgery.   On 06/09/2022, she underwent bilateral radioactive seed localized lumpectomies. Pathology confirmed a right breast fibroadenoma and a left breast that showed no residual intraductal papilloma. No sign of malignancy.    She now presents with several months of upper abdominal pain located mostly on her right side.  This is exacerbated by eating.  Her abdominal pain is associated with nausea and abdominal bloating.  Ultrasound showed cholelithiasis  and some hepatic steatosis.  The primary care physician ordered a HIDA scan that showed patent biliary tree and a gallbladder ejection fraction of 88%.  She denies any vomiting or diarrhea.   Review of Systems: A complete review of systems was obtained from the patient.  I have reviewed this information and discussed as appropriate with the patient.  See HPI as well for other ROS.   Review of Systems  Constitutional: Negative.   HENT: Negative.    Eyes: Negative.   Respiratory: Negative.    Cardiovascular: Negative.   Gastrointestinal:  Positive for abdominal pain and nausea.  Genitourinary:  Positive for flank pain.  Skin: Negative.   Neurological: Negative.   Endo/Heme/Allergies: Negative.   Psychiatric/Behavioral: Negative.          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     Lumbar degenerative disc disease 09/11/2022    Symptomatic lumbar degenerative disc disease posterior annular tears L4-5 L5-S1 with associated lumbar spinal stenosis/disc herniation L5-S1 left.     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     Major depressive disorder 09/05/2022   Migraines 09/05/2022   Spondylolisthesis of lumbar region 09/11/2022    Degenerative instability associated with structurally severe disc degeneration L5-S1.           Patient Active Problem List  Diagnosis   Bilateral nipple discharge   Fibroadenoma of right breast   Intraductal papilloma of breast, left   Pseudoclaudication syndrome   Essential  hypertension   Major depressive disorder   Migraines   Lumbar disc herniation with myelopathy   Lumbar degenerative disc disease   Left foot drop   Spondylolisthesis of lumbar region   Cervical polyp   Genitourinary syndrome of menopause   Axillary mass, bilateral   Postmenopausal bleeding           Past Surgical History:  Procedure Laterality Date   PERCUTANEOUS SPINAL FUSION Bilateral 09/28/2022    Procedure: BILATERAL LUMBAR DECOMPRESSION, STABILIZATION SPINAL FUSION L5-S1, EXCISION CENTRAL/LEFT DISC HERNIATION L5-S1, BILATERAL LUMBAR DECOMPRESSION L4-5. AUTOGRAFT/VIVIGEN;  Surgeon: Derian, Thomas Craig, MD;  Location: [facility] OR;  Service: Orthopedics;  Laterality: Bilateral;   INSTRUMENTATION NON-SEGMENTAL POSTERIOR SPINE N/A 09/28/2022    Procedure: INSTRUMENTATION POSTERIOR SPINE 1/2 VERTEBRAL SEGMENTS W/O FIXATION ADDITIONAL FIFTH;  Surgeon: Pascal Debby Banks, MD;  Location: [facility] OR;  Service: Orthopedics;  Laterality: N/A;   LAMINECTOMY POSTERIOR LUMBAR FACETECTOMY & FORAMINOTOMY W/DECOMP N/A 09/28/2022    Procedure: LAMINECTOMY, FACETECTOMY AND FORAMINOTOMY (UNILATERAL OR BILATERAL WITH DECOMPRESSION OF SPINAL CORD, CAUDA EQUINA AND/OR NERVE ROOT(S), SINGLE VERTEBRAL SEGMENT; LUMBAR;  Surgeon: Pascal Debby Banks, MD;  Location: [facility] OR;  Service: Orthopedics;  Laterality: N/A;   LAMINECTOMY POSTERIOR CERVICLE DECOMP W/FACETECTOMY & FORAMINOTOMY N/A 09/28/2022    Procedure: LAMINECTOMY, FACETECTOMY AND FORAMINOTOMY, SINGLE VERTEBRAL SEGMENT; EACH ADDITIONAL vertebral SEGMENT, CERVICAL, THORACIC, OR LUMBAR (LIST IN ADDITION TO PRIMARY PROCEDURE);  Surgeon: Pascal Debby Banks, MD;  Location: [facility] OR;  Service: Orthopedics;  Laterality: N/A;   BREAST BIOPSY       BREAST SURGERY       MASTECTOMY PARTIAL / LUMPECTOMY Bilateral      03/2022   SPINE SURGERY   7/24    L5/S1 fusion      No Known Allergies         Current Outpatient Medications on File Prior to Visit  Medication Sig  Dispense Refill   ALPRAZolam  (XANAX ) 0.25 MG tablet Take 0.25 mg by mouth 2 (two) times daily as needed       atenoloL  (TENORMIN ) 25 MG tablet Take 25 mg by mouth every evening       COQ10, UBIQUINOL, ORAL Take 1 tablet by mouth at bedtime       diphenhydrAMINE (BENADRYL) 25 mg capsule Take 25 mg by mouth every 6 (six) hours as needed for Itching       FLUoxetine  (PROZAC ) 20 MG capsule Take 20 mg by mouth once daily       ibuprofen (MOTRIN) 400 MG tablet Take 400 mg by mouth every 6 (six) hours as needed for Pain       meclizine (ANTIVERT) 25 mg tablet         melatonin 10 mg Cap Take 10 mg by mouth at bedtime       omeprazole (PRILOSEC OTC) 20 MG EC tablet Take 20 mg by mouth at bedtime       rosuvastatin (CRESTOR) 20 MG tablet Take 20 mg by mouth every evening        No current facility-administered  medications on file prior to visit.           Family History  Problem Relation Age of Onset   Diabetes Mother     Colon cancer Mother     Thyroid  disease Mother     Diabetes Brother     Breast cancer Maternal Aunt     Melanoma Maternal Aunt     Skin cancer Maternal Aunt        Social History       Tobacco Use  Smoking Status Never  Smokeless Tobacco Never      Social History         Socioeconomic History   Marital status: Married  Tobacco Use   Smoking status: Never   Smokeless tobacco: Never  Vaping Use   Vaping status: Never Used  Substance and Sexual Activity   Alcohol use: Yes      Alcohol/week: 2.0 standard drinks of alcohol      Types: 2 Standard drinks or equivalent per week   Drug use: Never   Sexual activity: Yes      Partners: Male      Birth control/protection: None  Other Topics Concern   Would you please tell us  about the people who live in your home, your pets, or anything else important to your social life? No    Social Drivers of Acupuncturist Strain: Low Risk  (08/17/2023)    Received from [facility]    Overall Financial  Resource Strain (CARDIA)     How hard is it for you to pay for the very basics like food, housing, medical care, and heating?: Not very hard  Food Insecurity: No Food Insecurity (08/17/2023)    Received from [facility]    Hunger Vital Sign     Within the past 12 months, you worried that your food would run out before you got the money to buy more.: Never true     Within the past 12 months, the food you bought just didn't last and you didn't have money to get more.: Never true  Transportation Needs: No Transportation Needs (08/17/2023)    Received from [facility] - Transportation     In the past 12 months, has lack of transportation kept you from medical appointments or from getting medications?: No     In the past 12 months, has lack of transportation kept you from meetings, work, or from getting things needed for daily living?: No  Physical Activity: Insufficiently Active (08/17/2023)    Received from [facility]    Exercise Vital Sign     On average, how many days per week do you engage in moderate to strenuous exercise (like a brisk walk)?: 2 days     On average, how many minutes do you engage in exercise at this level?: 20 min  Stress: Stress Concern Present (08/17/2023)    Received from [facility] - Occupational Stress Questionnaire     Do you feel stress - tense, restless, nervous, or anxious, or unable to sleep at night because your mind is troubled all the time - these days?: To some extent  Social Connections: Moderately Integrated (08/17/2023)    Received from [facility]    Social Connection and Isolation Panel     In a typical week, how many times do you talk on the phone with family, friends, or neighbors?:  More than three times a week     How often do you get together with friends or relatives?: Once a week     How often do you attend church or religious services?: 1 to 4 times per year     Do you belong to any clubs or  organizations such as church groups, unions, fraternal or athletic groups, or school groups?: No     Are you married, widowed, divorced, separated, never married, or living with a partner?: Married  Housing Stability: Low Risk  (03/16/2023)    Housing Stability Vital Sign     Unable to Pay for Housing in the Last Year: No     Number of Times Moved in the Last Year: 0     Homeless in the Last Year: No      Objective:         Vitals:    12/29/23 1020  Pulse: 89  Resp: 16  Temp: 36.6 C (97.9 F)  SpO2: 99%  Weight: 82.8 kg (182 lb 9.6 oz)  Height: 172.7 cm (5' 8)    Body mass index is 27.76 kg/m.   Physical Exam    Constitutional:  WDWN in NAD, conversant, no obvious deformities; lying in bed comfortably Eyes:  Pupils equal, round; sclera anicteric; moist conjunctiva; no lid lag HENT:  Oral mucosa moist; good dentition  Neck:  No masses palpated, trachea midline; no thyromegaly Lungs:  CTA bilaterally; normal respiratory effort CV:  Regular rate and rhythm; no murmurs; extremities well-perfused with no edema Abd:  +bowel sounds, soft, mildly tender right upper quadrant no palpable organomegaly; no palpable hernias Musc: Normal gait; no apparent clubbing or cyanosis in extremities Lymphatic:  No palpable cervical or axillary lymphadenopathy Skin:  Warm, dry; no sign of jaundice Psychiatric - alert and oriented x 4; calm mood and affect     Labs, Imaging and Diagnostic Testing: CLINICAL DATA:  RIGHT upper quadrant pain for 1 year   EXAM: ULTRASOUND ABDOMEN LIMITED RIGHT UPPER QUADRANT   COMPARISON:  None available   FINDINGS: Gallbladder:   Gallstones: 7 mm gallstone seen within the gallbladder lumen.   Sludge: Minimal   Gallbladder Wall: Within normal limits   Pericholecystic fluid: None   Sonographic Murphy's Sign: Negative per technologist   Common bile duct:   Diameter: 5 mm   Liver:   Parenchymal echogenicity: Diffusely echogenic   Contours:  Normal   Lesions: None   Portal vein: Patent.  Hepatopetal flow   Other: None.   IMPRESSION: 1. Minimal cholelithiasis. 2. Diffuse increased echogenicity of the hepatic parenchyma is a nonspecific indicator of hepatocellular dysfunction, most commonly steatosis.     Electronically Signed   By: Aliene Lloyd M.D.   On: 11/24/2023 09:11   CLINICAL DATA:  Abdominal pain, acute, nonlocalized.   EXAM: NUCLEAR MEDICINE HEPATOBILIARY IMAGING WITH GALLBLADDER EF   TECHNIQUE: Sequential images of the abdomen were obtained out to 60 minutes following intravenous administration of radiopharmaceutical. After oral ingestion of Ensure, gallbladder ejection fraction was determined. At 60 min, normal ejection fraction is greater than 33%.   RADIOPHARMACEUTICALS:  5.21 mCi Tc-51m  Choletec  IV   COMPARISON:  Ultrasound November 23, 2023   FINDINGS: Prompt uptake and biliary excretion of activity by the liver is seen. Gallbladder activity is visualized, consistent with patency of cystic duct. Biliary activity passes into small bowel, consistent with patent common bile duct.   Calculated gallbladder ejection fraction is 88%. (Normal gallbladder ejection fraction with Ensure is  greater than 33% and less than 80%.)   IMPRESSION: 1.  Patent cystic and common bile ducts.   2. Elevated gallbladder ejection fraction as can be seen with gallbladder hyperkinesia.     Electronically Signed   By: Reyes Holder M.D.   On: 12/12/2023 17:54   Assessment and Plan:  Diagnoses and all orders for this visit:   Calculus of gallbladder with chronic cholecystitis without obstruction     Recommend laparoscopic cholecystectomy with intraoperative cholangiogram.The surgical procedure has been discussed with the patient.  Potential risks, benefits, alternative treatments, and expected outcomes have been explained.  All of the patient's questions at this time have been answered.  The likelihood of  reaching the patient's treatment goal is good.  The patient understands the proposed surgical procedure and wishes to proceed.   Donnice POUR. Belinda, MD, [facility]  General Surgery   02/01/2024 7:29 AM

## 2024-02-01 NOTE — Op Note (Signed)
 Laparoscopic Cholecystectomy with IOC Procedure Note  Indications: This patient presents with symptomatic gallbladder disease and will undergo laparoscopic cholecystectomy.  She now presented with several months of upper abdominal pain located mostly on her right side. This is exacerbated by eating. Her abdominal pain is associated with nausea and abdominal bloating. Ultrasound showed cholelithiasis and some hepatic steatosis. The primary care physician ordered a HIDA scan that showed patent biliary tree and a gallbladder ejection fraction of 88%. She denies any vomiting or diarrhea.   Pre-operative Diagnosis: Calculus of gallbladder with other cholecystitis, without mention of obstruction  Post-operative Diagnosis: Same  Surgeon: Donnice MARLA Lima   Resident:  Dr. Marolyn Big  Anesthesia: General endotracheal anesthesia  ASA Class: 1  Procedure Details  The patient was seen again in the Holding Room. The risks, benefits, complications, treatment options, and expected outcomes were discussed with the patient. The possibilities of reaction to medication, pulmonary aspiration, perforation of viscus, bleeding, recurrent infection, finding a normal gallbladder, the need for additional procedures, failure to diagnose a condition, the possible need to convert to an open procedure, and creating a complication requiring transfusion or operation were discussed with the patient. The likelihood of improving the patient's symptoms with return to their baseline status is good.  The patient and/or family concurred with the proposed plan, giving informed consent. The site of surgery properly noted. The patient was taken to Operating Room, identified as Annette Rojas and the procedure verified as Laparoscopic Cholecystectomy with Intraoperative Cholangiogram. A Time Out was held and the above information confirmed.  Prior to the induction of general anesthesia, antibiotic prophylaxis was administered.  General endotracheal anesthesia was then administered and tolerated well. After the induction, the abdomen was prepped with Chloraprep and draped in the sterile fashion. The patient was positioned in the supine position.  Local anesthetic agent was injected into the skin below the umbilicus and an incision made. We dissected down to the abdominal fascia with blunt dissection.  The fascia was incised vertically and we entered the peritoneal cavity bluntly.  A pursestring suture of 0-Vicryl was placed around the fascial opening.  The Hasson cannula was inserted and secured with the stay suture.  Pneumoperitoneum was then created with CO2 and tolerated well without any adverse changes in the patient's vital signs. An 11-mm port was placed in the subxiphoid position.  Two 5-mm ports were placed in the right upper quadrant. All skin incisions were infiltrated with a local anesthetic agent before making the incision and placing the trocars.   We positioned the patient in reverse Trendelenburg, tilted slightly to the patient's left.  The gallbladder was identified, the fundus grasped and retracted cephalad. Adhesions were lysed bluntly and with the electrocautery where indicated, taking care not to injure any adjacent organs or viscus. The infundibulum was grasped and retracted laterally, exposing the peritoneum overlying the triangle of Calot. This was then divided and exposed in a blunt fashion. A critical view of the cystic duct and cystic artery was obtained.  The cystic duct was clearly identified and bluntly dissected circumferentially. The cystic duct was ligated with a clip distally.   An incision was made in the cystic duct and the [facility] cholangiogram catheter introduced. The catheter was secured using a clip. A cholangiogram was then obtained which showed good visualization of the distal and proximal biliary tree with no sign of filling defects or obstruction.  Contrast flowed easily into the duodenum. The  catheter was then removed.   The cystic duct  was then ligated with clips and divided. The cystic artery was identified, dissected free, ligated with clips and divided as well.   The gallbladder was dissected from the liver bed in retrograde fashion with the electrocautery. The gallbladder was removed and placed in a retrieval sac. The liver bed was irrigated and inspected. Hemostasis was achieved with the electrocautery. Copious irrigation was utilized and was repeatedly aspirated until clear.  The gallbladder and retrieval sac were then removed through the umbilical port site.  The pursestring suture was used to close the umbilical fascia.    We again inspected the right upper quadrant for hemostasis.  Pneumoperitoneum was released as we removed the trocars.  4-0 Monocryl was used to close the skin.   Benzoin, steri-strips, and clean dressings were applied. The patient was then extubated and brought to the recovery room in stable condition. Instrument, sponge, and needle counts were correct at closure and at the conclusion of the case.   Findings: Cholecystitis with Cholelithiasis  Estimated Blood Loss: Minimal         Drains: none         Specimens: Gallbladder           Complications: None; patient tolerated the procedure well.         Disposition: PACU - hemodynamically stable.         Condition: stable  Donnice POUR. Belinda, MD, [facility]  General Surgery   02/01/2024 11:05 AM

## 2024-02-02 ENCOUNTER — Encounter (HOSPITAL_COMMUNITY): Payer: Self-pay | Admitting: Surgery

## 2024-02-05 ENCOUNTER — Ambulatory Visit

## 2024-02-05 LAB — SURGICAL PATHOLOGY

## 2024-02-19 ENCOUNTER — Inpatient Hospital Stay: Admission: RE | Admit: 2024-02-19 | Discharge: 2024-02-19 | Attending: Family Medicine | Admitting: Family Medicine

## 2024-02-19 DIAGNOSIS — Z1231 Encounter for screening mammogram for malignant neoplasm of breast: Secondary | ICD-10-CM

## 2024-02-20 ENCOUNTER — Encounter: Payer: Self-pay | Admitting: Family Medicine

## 2024-02-20 ENCOUNTER — Ambulatory Visit (INDEPENDENT_AMBULATORY_CARE_PROVIDER_SITE_OTHER): Admitting: Family Medicine

## 2024-02-20 VITALS — BP 134/87 | HR 92 | Resp 16 | Ht 68.0 in | Wt 181.0 lb

## 2024-02-20 DIAGNOSIS — R1011 Right upper quadrant pain: Secondary | ICD-10-CM

## 2024-02-20 DIAGNOSIS — K760 Fatty (change of) liver, not elsewhere classified: Secondary | ICD-10-CM

## 2024-02-20 DIAGNOSIS — R748 Abnormal levels of other serum enzymes: Secondary | ICD-10-CM

## 2024-02-20 NOTE — Assessment & Plan Note (Signed)
 Continues to have a right upper quadrant pain that is constant.  Pain did not improve with cholecystectomy.

## 2024-02-20 NOTE — Assessment & Plan Note (Addendum)
 11/23/2023 ultrasound right upper quadrant diffusely echogenic liver most likely fatty infiltrate.  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

## 2024-02-20 NOTE — Progress Notes (Signed)
 "  Established Patient Office Visit  Subjective   Patient ID: Annette Rojas, female    DOB: Nov 20, 1965  Age: 58 y.o. MRN: [id number]  Chief Complaint  Patient presents with   Follow-up    HPI Discussed the use of AI scribe software for clinical note transcription with the patient, who gave verbal consent to proceed.  History of Present Illness   Annette Rojas is a 58 year old female with fatty liver, hx of migraines, chronic RUQ abdominal pain, anxiety and depression, right hip pain, s/p laparoscopic  cholecystectomy who presents with persistent abdominal pain and concerns about liver health.  She underwent recent cholecystectomy and reports a generally fine recovery, though she experienced increased pain in the umbilical area. She adheres to a bland diet to manage symptoms and had a minor episode of diarrhea after consuming nuts. She continues to experience a persistent dull ache in the right upper quadrant, described as a 'nagging' pain.  She has a known history of fatty liver, identified on an ultrasound of the right upper quadrant on November 23, 2023. Her brother is currently under care for cirrhosis, and on a transplant list.  She has been under significant stress since 2019, which she feels may have contributed to her health issues.  Her family history is significant for liver disease, with one brother who died from Cirrhosis and one brother on liver transplant list.  She mentions that her brother was told his condition is hereditary and metabolic. She is cautious with alcohol consumption due to the potential for liver damage.  Socially, she has been trying to improve her lifestyle by losing weight, having reduced from 200 pounds to 181 pounds through dietary changes such as increased water intake and healthier eating habits. She has a history of stress eating, particularly during family crises, which she believes contributed to her weight gain.  In the review of symptoms,  she denies current diarrhea but reports occasional nausea.      Annette Rojas is a 58 year old female with fatty liver who presents with persistent abdominal pain and concerns about liver health.  She underwent recent cholecystectomy and reports a generally fine recovery, though she experienced increased pain in the umbilical area. She adheres to a bland diet to manage symptoms and had a minor episode of diarrhea after consuming nuts. She continues to experience a persistent dull ache in the right upper quadrant, described as a 'nagging' pain.  She has a known history of fatty liver, identified on an ultrasound of the right upper quadrant on November 23, 2023. Her brother is currently under care for liver issues, and she is his caregiver. She has been under significant stress since 2019, which she feels may have contributed to her health issues.  Her family history is significant for liver disease, with her brother currently experiencing severe liver problems. She mentions that her brother was told his condition is hereditary and metabolic. She is cautious with alcohol consumption due to the potential for liver damage.  Socially, she has been trying to improve her lifestyle by losing weight, having reduced from 200 pounds to 181 pounds through dietary changes such as increased water intake and healthier eating habits. She has a history of stress eating, particularly during family crises, which she believes contributed to her weight gain.  In the review of symptoms, she denies current diarrhea but reports occasional nausea.     Objective:     BP 134/87   Pulse 92  Resp 16   Ht 5' 8 (1.727 m)   Wt 181 lb (82.1 kg)   LMP 08/23/2015   SpO2 98%   BMI 27.52 kg/m    Physical Exam Vitals and nursing note reviewed.  Constitutional:      Appearance: Normal appearance.  HENT:     Head: Normocephalic and atraumatic.  Eyes:     Conjunctiva/sclera: Conjunctivae normal.   Cardiovascular:     Rate and Rhythm: Normal rate and regular rhythm.  Pulmonary:     Effort: Pulmonary effort is normal.     Breath sounds: Normal breath sounds.  Musculoskeletal:     Right lower leg: No edema.     Left lower leg: No edema.  Skin:    General: Skin is warm and dry.  Neurological:     Mental Status: She is alert and oriented to person, place, and time.  Psychiatric:        Mood and Affect: Mood normal.        Behavior: Behavior normal.        Thought Content: Thought content normal.        Judgment: Judgment normal.          No results found for any visits on 02/20/24.    The 10-year ASCVD risk score (Arnett DK, et al., 2019) is: 5.3%    Assessment & Plan:  Fatty liver Assessment & Plan: 11/23/2023 ultrasound right upper quadrant diffusely echogenic liver most likely fatty infiltrate.  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    Orders: -     Alpha-1-antitrypsin -     HFE-Associated Hereditary Hemochromatosis (COHESION) -     FIB-4 w/Rx NASH FibroSure Plus  Elevated liver enzymes -     Alpha-1-antitrypsin -     HFE-Associated Hereditary Hemochromatosis (COHESION) -     FIB-4 w/Rx NASH FibroSure Plus  Right upper quadrant pain Assessment & Plan: Continues to have a right upper quadrant pain that is constant.  Pain did not improve with cholecystectomy.      No follow-ups on file.    Jaishawn Witzke K Almina Schul, MD "

## 2024-02-27 ENCOUNTER — Encounter: Payer: Self-pay | Admitting: Family Medicine

## 2024-02-27 LAB — HEMOCHROMATOSIS DNA-PCR(C282Y,H63D)

## 2024-02-27 LAB — FIB-4 W/RX NASH FIBROSURE PLUS
ALT: 27 IU/L (ref 0–32)
AST: 27 IU/L (ref 0–40)
FIB-4 Index: 1.01 (ref 0.00–2.67)
Platelets: 305 x10E3/uL (ref 150–450)

## 2024-02-27 LAB — ALPHA-1-ANTITRYPSIN: A-1 Antitrypsin: 134 mg/dL (ref 101–187)

## 2024-03-04 ENCOUNTER — Telehealth: Payer: Self-pay | Admitting: Family Medicine

## 2024-03-04 NOTE — Telephone Encounter (Signed)
 Called and LM that I called to go over her lab results with her.

## 2024-03-05 ENCOUNTER — Ambulatory Visit: Payer: Self-pay | Admitting: Family Medicine

## 2024-03-05 NOTE — Telephone Encounter (Signed)
Please see message sent by pt.

## 2024-03-06 NOTE — Telephone Encounter (Signed)
 Copied from CRM [id number]. Topic: General - Other >> Mar 06, 2024  2:59 PM Tobias CROME wrote: Reason for CRM: Patient requesting call from Dr. Ziglar. Patient also sent messages via mychart.   Best callback: [phone]

## 2024-03-06 NOTE — Telephone Encounter (Signed)
 The Wegovy pill is not out yet.  Her brother is on the transplant list at [facility].  The inheritable causes of liver failure were all negative.

## 2024-03-15 ENCOUNTER — Other Ambulatory Visit: Payer: Self-pay | Admitting: Family Medicine

## 2024-03-15 DIAGNOSIS — F419 Anxiety disorder, unspecified: Secondary | ICD-10-CM

## 2024-03-22 ENCOUNTER — Telehealth (HOSPITAL_BASED_OUTPATIENT_CLINIC_OR_DEPARTMENT_OTHER): Payer: Self-pay

## 2024-03-22 NOTE — Telephone Encounter (Signed)
 Copied from CRM #[id number]. Topic: General - Other >> Mar 21, 2024  5:43 PM Delon DASEN wrote: Reason for CRM: message for Dr Ziglar- [facility] in [place] has the Wegovy  pill so she call it in there

## 2024-03-23 ENCOUNTER — Other Ambulatory Visit: Payer: Self-pay | Admitting: Family Medicine

## 2024-03-23 MED ORDER — WEGOVY 1.5 MG PO TABS: 1.5000 mg | ORAL_TABLET | Freq: Every day | ORAL | 0 refills | Status: AC
